# Patient Record
Sex: Female | Born: 1975 | Race: Black or African American | Hispanic: No | Marital: Single | Smoking: Never smoker
Health system: Southern US, Community
[De-identification: ages and names within clinical notes are randomized; demographics above are authoritative.]

## PROBLEM LIST (undated history)

## (undated) HISTORY — PX: NO PAST SURGERIES: SHX2092

---

## 2007-05-19 ENCOUNTER — Emergency Department: Payer: Self-pay | Admitting: Emergency Medicine

## 2011-09-09 ENCOUNTER — Ambulatory Visit: Payer: Self-pay | Admitting: Family Medicine

## 2011-09-09 LAB — WET PREP, GENITAL

## 2012-03-17 DIAGNOSIS — I8310 Varicose veins of unspecified lower extremity with inflammation: Secondary | ICD-10-CM | POA: Insufficient documentation

## 2014-07-26 DIAGNOSIS — M4802 Spinal stenosis, cervical region: Secondary | ICD-10-CM | POA: Insufficient documentation

## 2017-02-11 DIAGNOSIS — H60333 Swimmer's ear, bilateral: Secondary | ICD-10-CM | POA: Insufficient documentation

## 2017-06-09 ENCOUNTER — Other Ambulatory Visit: Payer: Self-pay

## 2017-06-09 ENCOUNTER — Ambulatory Visit
Admission: RE | Admit: 2017-06-09 | Discharge: 2017-06-09 | Disposition: A | Discharge status: Home / Self Care | Payer: BC Managed Care – PPO | Source: Ambulatory Visit | Attending: Emergency Medicine | Admitting: Emergency Medicine

## 2017-06-09 ENCOUNTER — Ambulatory Visit
Admission: EM | Admit: 2017-06-09 | Discharge: 2017-06-09 | Disposition: A | Discharge status: Home / Self Care | Payer: BC Managed Care – PPO

## 2017-06-09 DIAGNOSIS — M7071 Other bursitis of hip, right hip: Secondary | ICD-10-CM

## 2017-06-09 DIAGNOSIS — M79604 Pain in right leg: Secondary | ICD-10-CM

## 2017-06-09 DIAGNOSIS — M5431 Sciatica, right side: Secondary | ICD-10-CM

## 2017-06-09 MED ORDER — CYCLOBENZAPRINE HCL 5 MG PO TABS: 5.0000 mg | ORAL_TABLET | Freq: Two times a day (BID) | ORAL | 0 refills | Status: DC | PRN

## 2017-06-09 MED ORDER — PREDNISONE 10 MG PO TABS: ORAL_TABLET | ORAL | 0 refills | Status: DC

## 2017-06-09 NOTE — ED Provider Notes (Signed)
[facility] ____________________________________________  Time seen: Approximately 1221 PM  I have reviewed the triage vital signs and the nursing notes.   HISTORY  Chief Complaint Groin Pain  HPI Jody Paul is a 42 y.o. female presenting for evaluation of right lower back and hip pain that is been present constantly since this morning.  Patient reports she initially had same pain on 05/31/17 that lasted few a few days and then  mostly resolved, but states increased again today.  Denies any fall, direct trauma or known injury.  Denies history of same in the past.  States pain stays constantly and right upper buttocks and right hip, intermittently and right anterior hip.  States that she intermittently has pain described as a burning tingly pain that radiates down her right leg from the back of her right buttocks to her foot.  Denies loss of sensation.  Denies urinary or bowel retention or incontinence.  Denies any rash or skin changes.  Again denies trauma.  Denies known trigger.  Denies any urinary frequency, urinary urgency or urinary frequency.  Denies fevers, cough, abdominal pain, flank pain.  Denies chance of pregnancy.  States does not like to take medications very often and has very only occasionally taken any over-the-counter medication.  States occasionally has taken aspirin without any change.  Has tried stretching the area.  States pain improves with standing still and leaning on her left leg, as well as laying still, states pain is worse with direct palpation to right posterior hip, bending and moving.  Again reports otherwise feels well.  Denies any recent immobilization, hemoptysis, oral contraceptives, leg swelling or skin changes.Denies recent sickness. Denies recent antibiotic use.   Patient's last menstrual period was 05/27/2017.Denies pregnancy.  States last period spotted longer than normal.  Denies any continued bleeding.   History reviewed. No pertinent  past medical history.  There are no active problems to display for this patient.   Past Surgical History:  Procedure Laterality Date  . NO PAST SURGERIES       No current facility-administered medications for this encounter.   Current Outpatient Medications:  .  Calcium Carbonate-Vit D-Min (CALCIUM 1200 PO), Take by mouth., Disp: , Rfl:  .  ELDERBERRY PO, Take by mouth., Disp: , Rfl:  .  Flaxseed, Linseed, (FLAX SEED OIL) 1000 MG CAPS, Take by mouth., Disp: , Rfl:  .  Multiple Vitamin (MULTIVITAMIN) LIQD, Take 5 mLs by mouth daily., Disp: , Rfl:  .  cyclobenzaprine (FLEXERIL) 5 MG tablet, Take 1 tablet (5 mg total) by mouth 2 (two) times daily as needed for muscle spasms. Do not drive as can cause drowsiness, Disp: 15 tablet, Rfl: 0 .  predniSONE (DELTASONE) 10 MG tablet, Start 60 mg po day one, then 50 mg po day two, taper by 10 mg daily until complete., Disp: 21 tablet, Rfl: 0  Allergies Patient has no known allergies.  History reviewed. No pertinent family history.  Social History Social History   Tobacco Use  . Smoking status: Never Smoker  . Smokeless tobacco: Never Used  Substance Use Topics  . Alcohol use: Yes    Comment: occasionally  . Drug use: No    Review of Systems Constitutional: No fever/chills Cardiovascular: Denies chest pain. Respiratory: Denies shortness of breath. Gastrointestinal: No abdominal pain.  No nausea, no vomiting.  No diarrhea.  No constipation. Genitourinary: Negative for dysuria. Musculoskeletal: As above.  Skin: Negative for rash. Neurological: Negative for headaches or focal weakness.   ____________________________________________  PHYSICAL EXAM:  VITAL SIGNS: ED Triage Vitals  Enc Vitals Group     BP 06/09/17 1216 120/83     Pulse Rate 06/09/17 1216 68     Resp 06/09/17 1216 16     Temp 06/09/17 1216 98.1 F (36.7 C)     Temp Source 06/09/17 1216 Oral     SpO2 06/09/17 1216 100 %     Weight 06/09/17 1213 130 lb (59  kg)     Height 06/09/17 1213 5\' 2"  (1.575 m)     Head Circumference --      Peak Flow --      Pain Score 06/09/17 1213 9     Pain Loc --      Pain Edu? --      Excl. in [place]? --     Constitutional: Alert and oriented. Well appearing and in no acute distress. Eyes: Conjunctivae are normal.  ENT      Head: Normocephalic and atraumatic.      Nose: No congestion/rhinnorhea.      Mouth/Throat: Mucous membranes are moist.Oropharynx non-erythematous. Neck: No stridor. Supple without meningismus.  Hematological/Lymphatic/Immunilogical: No cervical lymphadenopathy. Cardiovascular: Normal rate, regular rhythm. Grossly normal heart sounds.  Good peripheral circulation. Respiratory: Normal respiratory effort without tachypnea nor retractions. Breath sounds are clear and equal bilaterally. No wheezes, rales, rhonchi. Gastrointestinal: Soft and nontender. No distention. Normal Bowel sounds. No CVA tenderness. Musculoskeletal:  Nontender with normal range of motion in all extremities. No midline cervical, thoracic or lumbar tenderness to palpation. Bilateral pedal pulses equal and easily palpated.      Right lower leg:  No tenderness or edema.      Left lower leg:  No tenderness or edema.  Except: Right lateral hip that point greater trochanter mild tenderness to palpation as well as mild tenderness palpation right piriformis right and moderate tenderness to right lower sciatic notch area, mild tenderness to anterior right hip flexor, right leg and right back pain increase with right hip lateral abduction as well as standing knee lift, no pain with left knee lifts, able to weight-bear independently each leg, bilateral plantar flexion and dorsiflexion strong and equal, no saddle anesthesia, right lower extremity otherwise nontender.  No rash, no lesions and no ecchymosis.  No other pelvic or bony tenderness noted. Changes positions quickly. Ambulatory in room with antalgic gait. Neurologic:  Normal speech and  language. No gross focal neurologic deficits are appreciated. Speech is normal.  Skin:  Skin is warm, dry and intact. No rash noted. Psychiatric: Mood and affect are normal. Speech and behavior are normal. Patient exhibits appropriate insight and judgment   ___________________________________________   LABS (all labs ordered are listed, but only abnormal results are displayed)  Labs Reviewed - No data to display ____________________________________________  RADIOLOGY  US Venous Img Lower Unilateral Right  Result Date: 06/09/2017 CLINICAL DATA:  Right lower extremity pain for the past 2 days. Evaluate for DVT. EXAM: RIGHT LOWER EXTREMITY VENOUS DOPPLER ULTRASOUND TECHNIQUE: Gray-scale sonography with graded compression, as well as color Doppler and duplex ultrasound were performed to evaluate the lower extremity deep venous systems from the level of the common femoral vein and including the common femoral, femoral, profunda femoral, popliteal and calf veins including the posterior tibial, peroneal and gastrocnemius veins when visible. The superficial great saphenous vein was also interrogated. Spectral Doppler was utilized to evaluate flow at rest and with distal augmentation maneuvers in the common femoral, femoral and popliteal veins. COMPARISON:  None. FINDINGS: Contralateral  Common Femoral Vein: Respiratory phasicity is normal and symmetric with the symptomatic side. No evidence of thrombus. Normal compressibility. Common Femoral Vein: No evidence of thrombus. Normal compressibility, respiratory phasicity and response to augmentation. Saphenofemoral Junction: No evidence of thrombus. Normal compressibility and flow on color Doppler imaging. Profunda Femoral Vein: No evidence of thrombus. Normal compressibility and flow on color Doppler imaging. Femoral Vein: No evidence of thrombus. Normal compressibility, respiratory phasicity and response to augmentation. Popliteal Vein: No evidence of thrombus.  Normal compressibility, respiratory phasicity and response to augmentation. Calf Veins: No evidence of thrombus. Normal compressibility and flow on color Doppler imaging. Superficial Great Saphenous Vein: No evidence of thrombus. Normal compressibility. Venous Reflux:  None. Other Findings:  None. IMPRESSION: No evidence of DVT within the right lower extremity. Electronically Signed   By: Sandi Mariscal M.D.   On: 06/09/2017 13:51   ____________________________________________   PROCEDURES Procedures    INITIAL IMPRESSION / ASSESSMENT AND PLAN / ED COURSE  Pertinent labs & imaging results that were available during my care of the patient were reviewed by me and considered in my medical decision making (see chart for details).  Overall well-appearing patient.  Patient appears uncomfortable leaning on left leg and bending over exam table.  Patient full range of motion present exam as above.  Suspect inflammatory changes, right hip bursitis and right sciatica.  No focal neurological deficits.  Doubt infectious.  Doubt DVT, patient expresses concern of DVT and request ultrasound to be completed.  Ultrasound performed outpatient and ultrasound venous right lower extremity results as above and negative for DVT per radiologist.  Patient informed of this.  Encouraged rest, supportive care, stretching and will treat patient with oral prednisone and PRN Flexeril.  Patient declines pain medication prescription.  Discussed strict follow-up and return parameters.Discussed indication, risks and benefits of medications with patient.  Discussed follow up with Primary care physician this week. Discussed follow up and return parameters including no resolution or any worsening concerns. Patient verbalized understanding and agreed to plan.   ____________________________________________   FINAL CLINICAL IMPRESSION(S) / ED DIAGNOSES  Final diagnoses:  Right sided sciatica  Bursitis of right hip, unspecified bursa    Right leg pain     ED Discharge Orders        Ordered    US Venous Img Lower Unilateral Right     06/09/17 1242    predniSONE (DELTASONE) 10 MG tablet     06/09/17 1245    cyclobenzaprine (FLEXERIL) 5 MG tablet  2 times daily PRN     06/09/17 1245       Note: This dictation was prepared with Dragon dictation along with smaller phrase technology. Any transcriptional errors that result from this process are unintentional.         Marylene Land, NP 06/09/17 1416

## 2017-06-09 NOTE — ED Triage Notes (Signed)
Patient complains of right groin pain that started on 1/14. Patient states that it improved but worsened again this morning. Patient states that she has pain down the back of right leg and radiates down. Patient states that she has had tingling in her right leg.

## 2017-06-09 NOTE — Discharge Instructions (Signed)
Take medication as prescribed. Rest. Drink plenty of fluids. Ice. Stretch. Have ultrasound completed.   Follow up with your primary care physician this week as needed. Return to Urgent care or Emergency room for new or worsening concerns.

## 2018-01-13 DIAGNOSIS — Z8742 Personal history of other diseases of the female genital tract: Secondary | ICD-10-CM | POA: Insufficient documentation

## 2018-04-05 IMAGING — US US EXTREM LOW VENOUS*R*
1 series · 13 of 24 positions shown · non-contrast
Comparison: None.

CLINICAL DATA: Right lower extremity pain for the past 2 days.
Evaluate for DVT.



[Series 1: us extrem low venous*right* · 0.06mm/px · 13 of 34 slices shown]
[im 1/34]
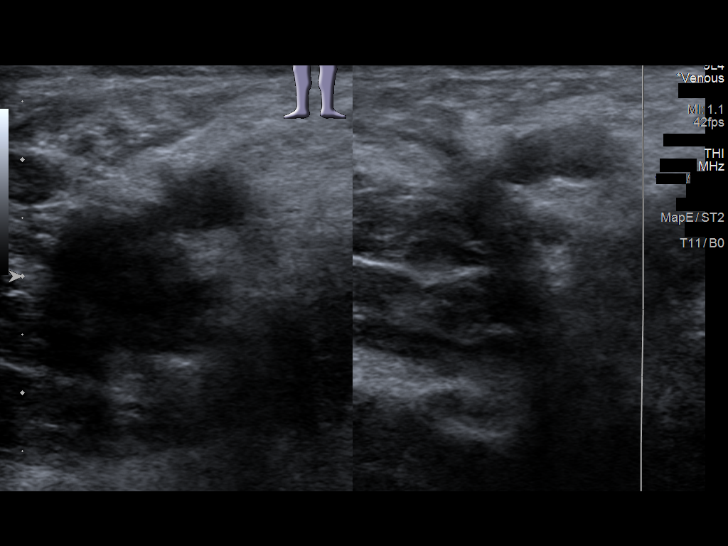
[im 3/34]
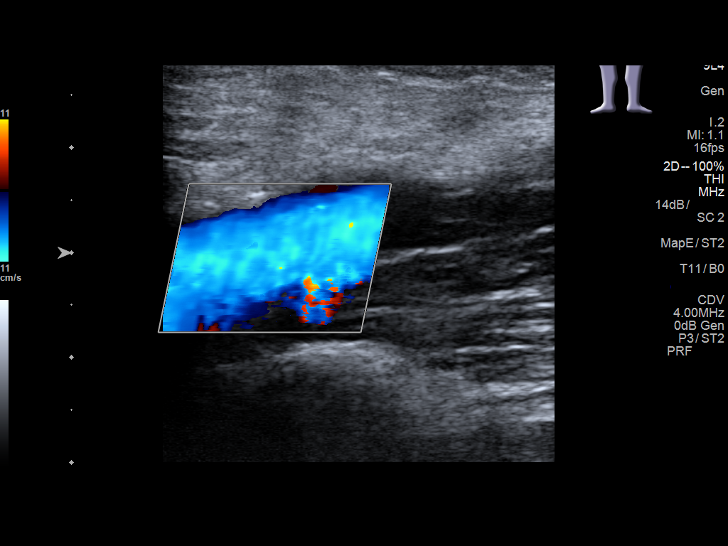
[im 6/34]
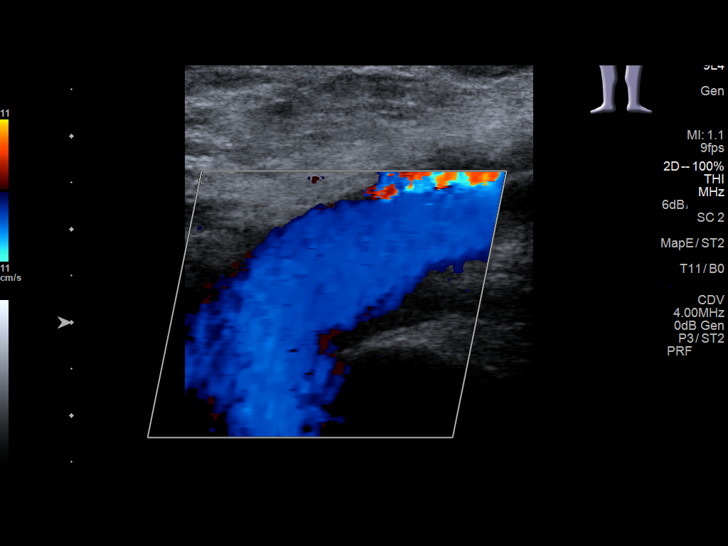
[im 9/34]
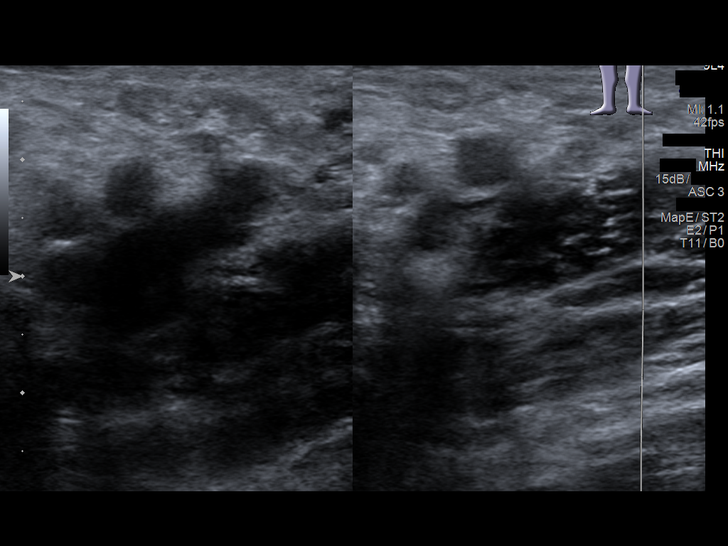
[im 12/34]
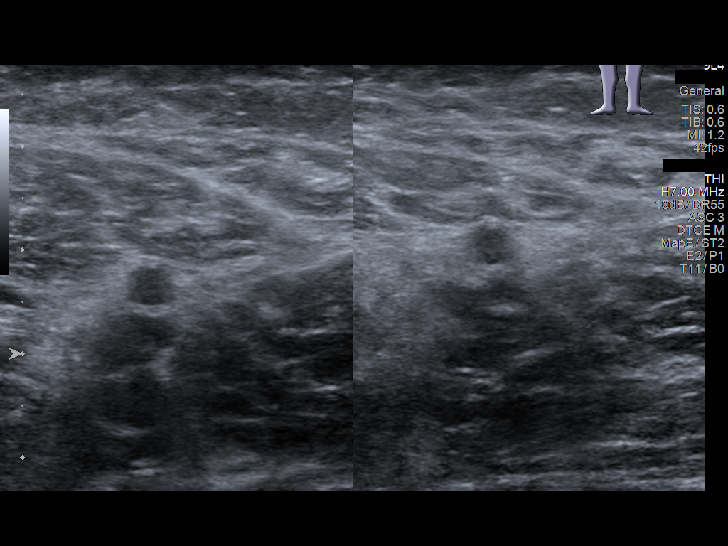
[im 15/34]
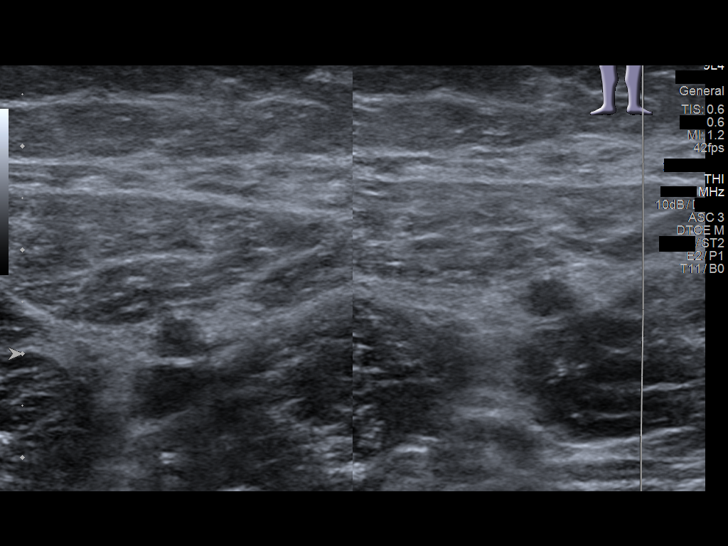
[im 18/34]
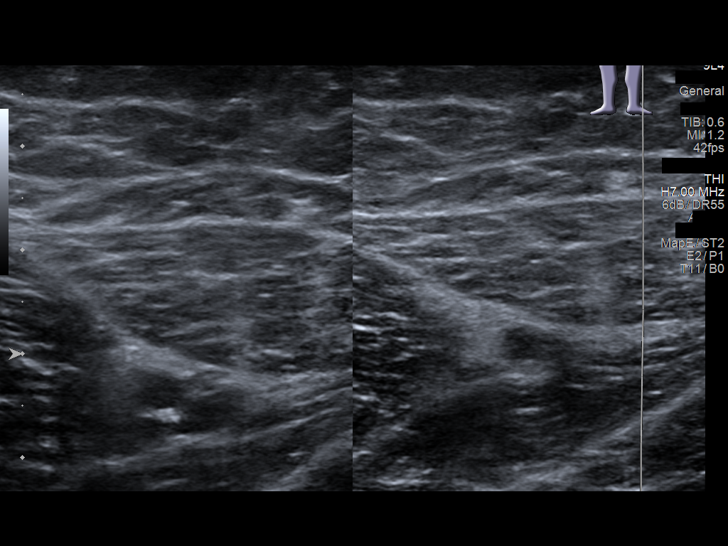
[im 19/34]
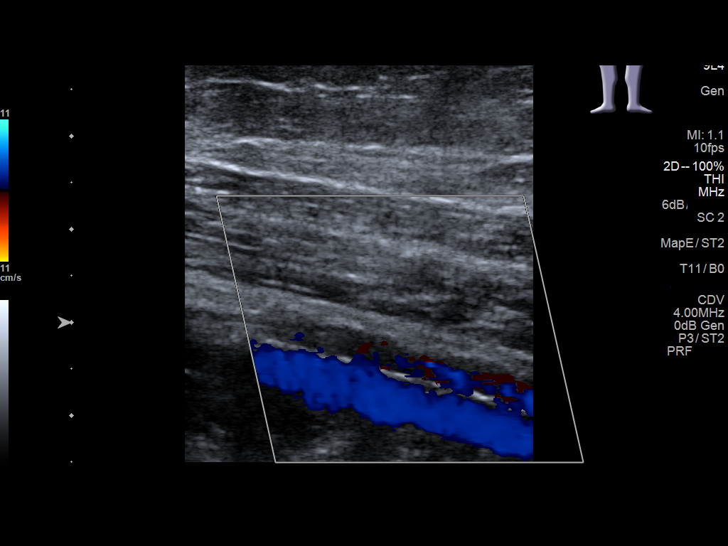
[im 22/34]
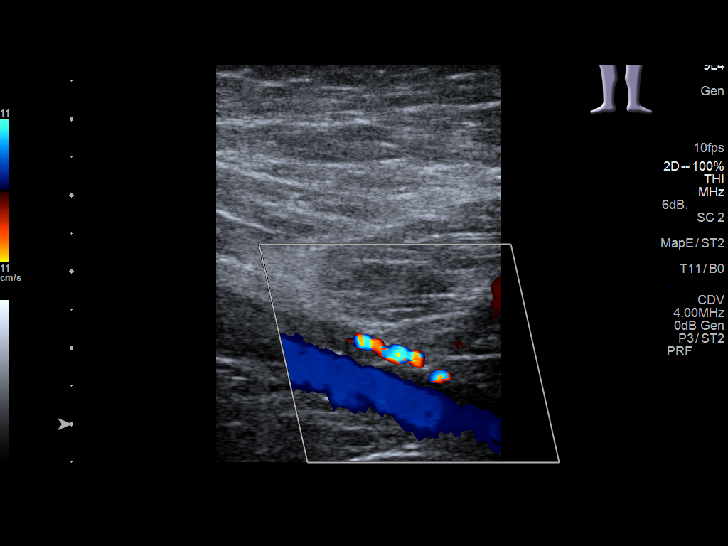
[im 25/34]
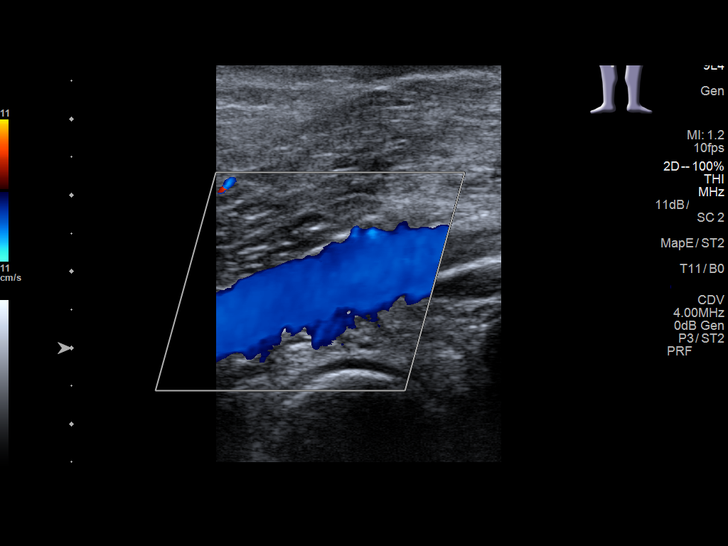
[im 28/34]
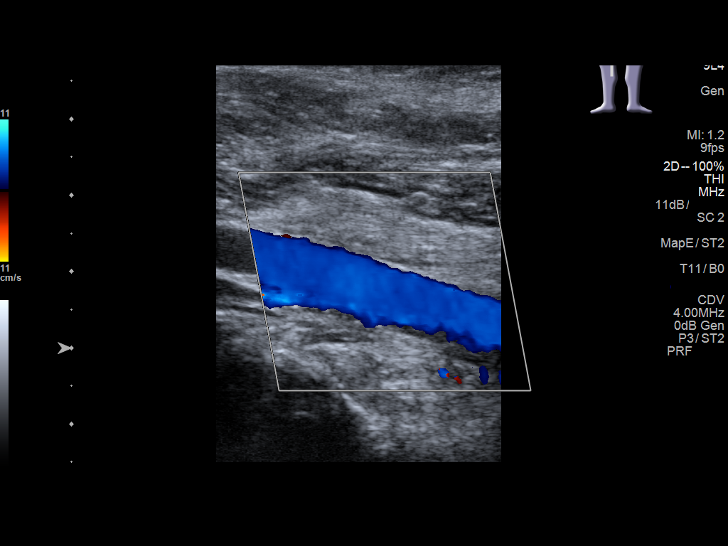
[im 31/34]
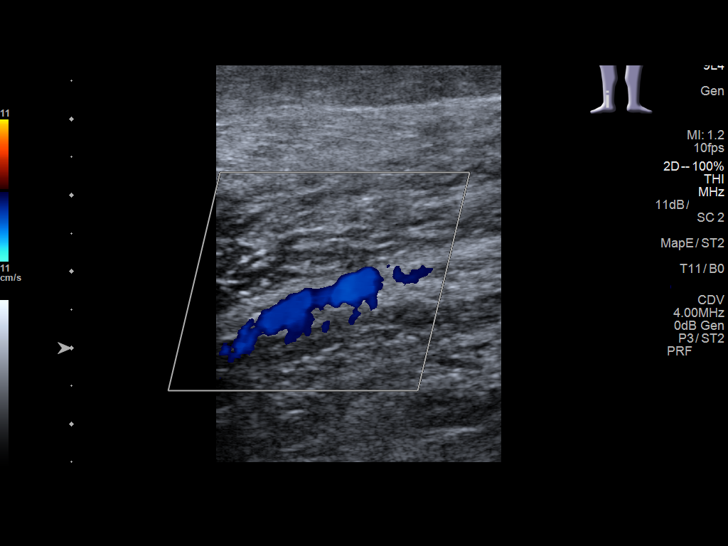
[im 34/34]
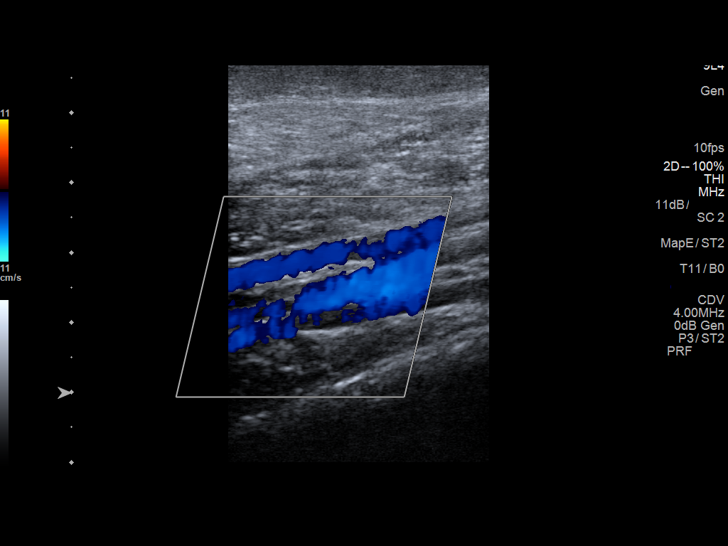

[13 of 24 positions shown; findings below may reference images not displayed]

FINDINGS: Contralateral Common Femoral Vein: Respiratory phasicity is normal
and symmetric with the symptomatic side. No evidence of thrombus.
Normal compressibility.

Common Femoral Vein: No evidence of thrombus. Normal
compressibility, respiratory phasicity and response to augmentation.

Saphenofemoral Junction: No evidence of thrombus. Normal
compressibility and flow on color Doppler imaging.

Profunda Femoral Vein: No evidence of thrombus. Normal
compressibility and flow on color Doppler imaging.

Femoral Vein: No evidence of thrombus. Normal compressibility,
respiratory phasicity and response to augmentation.

Popliteal Vein: No evidence of thrombus. Normal compressibility,
respiratory phasicity and response to augmentation.

Calf Veins: No evidence of thrombus. Normal compressibility and flow
on color Doppler imaging.

Superficial Great Saphenous Vein: No evidence of thrombus. Normal
compressibility.

Venous Reflux:  None.

Other Findings:  None.
IMPRESSION: No evidence of DVT within the right lower extremity.

## 2019-02-06 DIAGNOSIS — G5620 Lesion of ulnar nerve, unspecified upper limb: Secondary | ICD-10-CM | POA: Insufficient documentation

## 2019-02-06 DIAGNOSIS — R200 Anesthesia of skin: Secondary | ICD-10-CM | POA: Insufficient documentation

## 2019-10-25 DIAGNOSIS — R4020 Unspecified coma: Secondary | ICD-10-CM | POA: Insufficient documentation

## 2019-10-25 DIAGNOSIS — G43719 Chronic migraine without aura, intractable, without status migrainosus: Secondary | ICD-10-CM | POA: Insufficient documentation

## 2019-10-25 DIAGNOSIS — S0990XA Unspecified injury of head, initial encounter: Secondary | ICD-10-CM | POA: Insufficient documentation

## 2019-10-25 DIAGNOSIS — G43909 Migraine, unspecified, not intractable, without status migrainosus: Secondary | ICD-10-CM | POA: Insufficient documentation

## 2019-12-05 DIAGNOSIS — R519 Headache, unspecified: Secondary | ICD-10-CM | POA: Insufficient documentation

## 2020-07-01 LAB — RESULTS CONSOLE HPV: CHL HPV: POSITIVE

## 2021-05-02 LAB — CBC AND DIFFERENTIAL
HCT: 31 — AB (ref 36–46)
Hemoglobin: 10 — AB (ref 12.0–16.0)
Platelets: 300 10*3/uL (ref 150–400)
WBC: 4

## 2021-05-02 LAB — BASIC METABOLIC PANEL
BUN: 7 (ref 4–21)
CO2: 29 — AB (ref 13–22)
Chloride: 106 (ref 99–108)
Creatinine: 0.6 (ref <=1.1)
Glucose: 80
Potassium: 3.3 mEq/L — AB (ref 3.5–5.1)
Sodium: 141 (ref 137–147)

## 2021-05-02 LAB — HEPATIC FUNCTION PANEL
ALT: 10 U/L (ref 7–35)
AST: 21 (ref 13–35)
Alkaline Phosphatase: 56 (ref 25–125)
Bilirubin, Total: 0.3

## 2021-05-02 LAB — COMPREHENSIVE METABOLIC PANEL: Calcium: 8.9 (ref 8.7–10.7)

## 2021-07-23 ENCOUNTER — Encounter: Payer: Self-pay | Admitting: Family Medicine

## 2021-07-23 ENCOUNTER — Ambulatory Visit: Payer: BC Managed Care – PPO | Admitting: Family Medicine

## 2021-07-23 ENCOUNTER — Other Ambulatory Visit: Payer: Self-pay

## 2021-07-23 VITALS — BP 128/80 | HR 66 | Ht 62.0 in | Wt 124.6 lb

## 2021-07-23 DIAGNOSIS — R071 Chest pain on breathing: Secondary | ICD-10-CM

## 2021-07-23 DIAGNOSIS — J4540 Moderate persistent asthma, uncomplicated: Secondary | ICD-10-CM | POA: Diagnosis not present

## 2021-07-23 DIAGNOSIS — R6881 Early satiety: Secondary | ICD-10-CM | POA: Diagnosis not present

## 2021-07-23 DIAGNOSIS — J45909 Unspecified asthma, uncomplicated: Secondary | ICD-10-CM | POA: Insufficient documentation

## 2021-07-23 DIAGNOSIS — D219 Benign neoplasm of connective and other soft tissue, unspecified: Secondary | ICD-10-CM | POA: Insufficient documentation

## 2021-07-23 DIAGNOSIS — D649 Anemia, unspecified: Secondary | ICD-10-CM | POA: Insufficient documentation

## 2021-07-23 DIAGNOSIS — M5030 Other cervical disc degeneration, unspecified cervical region: Secondary | ICD-10-CM | POA: Insufficient documentation

## 2021-07-23 DIAGNOSIS — D500 Iron deficiency anemia secondary to blood loss (chronic): Secondary | ICD-10-CM | POA: Diagnosis not present

## 2021-07-23 NOTE — Assessment & Plan Note (Signed)
Lungs clear today. Continue dulera. Advised her to use it daily and to use the albuterol prior to exercise. Call with any concerns. Continue to monitor.  ?

## 2021-07-23 NOTE — Progress Notes (Signed)
BP 128/80    Pulse 66    Ht '5\' 2"'$  (1.575 m)    Wt 124 lb 9.6 oz (56.5 kg)    LMP 07/23/2021 (Exact Date)    SpO2 100%    BMI 22.79 kg/m    Subjective:    Patient ID: Jody Paul, female    DOB: [birth year], 46 y.o.   MRN: [id number]  HPI: Jody Paul is a 46 y.o. female who presents today to establish care.  Chief Complaint  Patient presents with   Establish Care    Patient requesting GI referral patient states she has the desire to eat but gets full quickly.   early satiety   ANEMIA Anemia status: stable Etiology of anemia: iron def Duration of anemia treatment: chronic  Compliance with treatment: excellent compliance Iron supplementation side effects: yes- constipation. Better on liquid iron Severity of anemia: moderate Fatigue: yes Decreased exercise tolerance: yes  Dyspnea on exertion: yes Palpitations: no Bleeding: no Pica: no  Paul presents today to establish care. She has been having some pain in her R hip with her cycle for about the past 4 years. It is doing better. She notes that she was checked for a blood clot previously and it was negative. She does see GYN. She does have a fibroid. She has been having issues with her stomach for about 4 years. She has some chest pain which has been worked up extensively by cardiology and does not seem to be cardiac. There is a concern that it is esophageal. She has been having early satiety. Saw GI several years ago and had a liver hemangioma on an [place]. She changed her diet and started to feel a little better. Has been a bit constipated. Taking miralax occasionally. Stopped the iron pills and went to liquid iron. 2020 started with some issues with breathing. This has been much better since she started on the dulera. 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 She is generally feeling well. No other concerns or complaints at this time.   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   Calcium Carbonate-Vit D-Min (CALCIUM 1200 PO) Take by mouth.   ELDERBERRY PO Take by mouth.   Flaxseed, Linseed, (FLAX SEED OIL) 1000 MG CAPS Take by mouth.   mometasone-formoterol (DULERA) 100-5 MCG/ACT AERO Inhale 2 puffs into the lungs 2 (two) times daily.   Multiple Vitamin (MULTIVITAMIN) LIQD Take 5 mLs by mouth daily.   [DISCONTINUED] cyclobenzaprine (FLEXERIL) 5 MG tablet Take 1 tablet (5 mg total) by mouth 2 (two) times daily as needed for muscle spasms. Do not drive as can cause drowsiness (Patient  not taking: Reported on 07/23/2021)   [DISCONTINUED] predniSONE (DELTASONE) 10 MG tablet Start 60 mg po day one, then 50 mg po day two, taper by 10 mg daily until complete. (Patient not taking: Reported on 07/23/2021)   No facility-administered encounter medications on file as of 07/23/2021.   No Known Allergies  Social History   Socioeconomic History   Marital status: Single    Spouse name: Not on file   Number of children: Not on file   Years of education: Not on file   Highest education level: Not on file  Occupational History   Not on file  Tobacco Use   Smoking status: Never   Smokeless tobacco: Never  Vaping Use   Vaping Use:  Never used  Substance and Sexual Activity   Alcohol use: Yes    Alcohol/week: 1.0 standard drink    Types: 1 Glasses of wine per week    Comment: occasionally   Drug use: No   Sexual activity: Not Currently  Other Topics Concern   Not on file  Social History Narrative   Not on file   Social Determinants of Health   Financial Resource Strain: Not on file  Food Insecurity: Not on file  Transportation Needs: Not on file  Physical Activity: Not on file  Stress: Not on file  Social Connections: Not on file   Family History  Problem Relation Age of Onset   Stroke Mother    Cancer Mother        lung cancer   Cancer Maternal Grandfather      Review of Systems  Constitutional: Negative.   Respiratory: Negative.    Cardiovascular: Negative.   Gastrointestinal:  Positive for abdominal pain. Negative for abdominal distention, anal bleeding, blood in stool, constipation, diarrhea, nausea, rectal pain and vomiting.  Musculoskeletal: Negative.   Psychiatric/Behavioral: Negative.     Per HPI unless specifically indicated above     Objective:    BP 128/80    Pulse 66    Ht '5\' 2"'$  (1.575 m)    Wt 124 lb 9.6 oz (56.5 kg)    LMP 07/23/2021 (Exact Date)    SpO2 100%    BMI 22.79 kg/m   Wt Readings from Last 3 Encounters:  07/23/21 124 lb 9.6 oz (56.5 kg)  06/09/17 130 lb (59 kg)    Physical Exam Vitals and nursing note reviewed.  Constitutional:      General: She is not in acute distress.    Appearance: Normal appearance. She is not ill-appearing, toxic-appearing or diaphoretic.  HENT:     Head: Normocephalic and atraumatic.     Right Ear: External ear normal.     Left Ear: External ear normal.     Nose: Nose normal.     Mouth/Throat:     Mouth: Mucous membranes are moist.     Pharynx: Oropharynx is clear.  Eyes:     General: No scleral icterus.       Right eye: No discharge.        Left eye: No discharge.     Extraocular Movements: Extraocular movements intact.      Conjunctiva/sclera: Conjunctivae normal.     Pupils: Pupils are equal, round, and reactive to light.  Cardiovascular:     Rate and Rhythm: Normal rate and regular rhythm.     Pulses: Normal pulses.     Heart sounds: Normal heart sounds. No murmur heard.   No friction rub. No gallop.  Pulmonary:  Effort: Pulmonary effort is normal. No respiratory distress.     Breath sounds: Normal breath sounds. No stridor. No wheezing, rhonchi or rales.  Chest:     Chest wall: No tenderness.  Abdominal:     General: Abdomen is flat. Bowel sounds are normal. There is no distension.     Palpations: Abdomen is soft. There is no mass.     Tenderness: There is no abdominal tenderness. There is no right CVA tenderness, left CVA tenderness, guarding or rebound.     Hernia: No hernia is present.  Musculoskeletal:        General: Normal range of motion.     Cervical back: Normal range of motion and neck supple.  Skin:    General: Skin is warm and dry.     Capillary Refill: Capillary refill takes less than 2 seconds.     Coloration: Skin is not jaundiced or pale.     Findings: No bruising, erythema, lesion or rash.  Neurological:     General: No focal deficit present.     Mental Status: She is alert and oriented to person, place, and time. Mental status is at baseline.  Psychiatric:        Mood and Affect: Mood normal.        Behavior: Behavior normal.        Thought Content: Thought content normal.        Judgment: Judgment normal.    Results for orders placed or performed in visit on 07/23/21  CBC and differential  Result Value Ref Range   Hemoglobin 10.0 (A) 12.0 - 16.0   HCT 31 (A) 36 - 46   Platelets 300 150 - 400 K/uL   WBC 4.0   Basic metabolic panel  Result Value Ref Range   Glucose 80    BUN 7 4 - 21   CO2 29 (A) 13 - 22   Creatinine 0.6 0.5 - 1.1   Potassium 3.3 (A) 3.5 - 5.1 mEq/L   Sodium 141 137 - 147   Chloride 106 99 - 108  Comprehensive metabolic panel  Result Value Ref  Range   Calcium 8.9 8.7 - 10.7  Hepatic function panel  Result Value Ref Range   Alkaline Phosphatase 56 25 - 125   ALT 10 7 - 35 U/L   AST 21 13 - 35   Bilirubin, Total 0.3   Results Console HPV  Result Value Ref Range   CHL HPV Positive       Assessment & Plan:   Problem List Items Addressed This Visit       Respiratory   Asthma    Lungs clear today. Continue dulera. Advised her to use it daily and to use the albuterol prior to exercise. Call with any concerns. Continue to monitor.       Relevant Medications   mometasone-formoterol (DULERA) 100-5 MCG/ACT AERO   albuterol (VENTOLIN HFA) 108 (90 Base) MCG/ACT inhaler     Other   Anemia    Continue iron supplementation. Last check had a hgb of 10. Will recheck next visit. Will get her into GI for occult GI loss. Likely due to menses.       Other Visit Diagnoses     Early satiety    -  Primary   Would like to see GI. Referral generated today. Call with any concerns.    Relevant Orders   Ambulatory referral to Gastroenterology   Chest pain on breathing       ?  esophageal vs muscular vs pulmonary. Will get her into GI for evaluation and try voltaren PRN to see if it's muscular. Call with any concerns.         Follow up plan: Return in about 6 months (around 01/23/2022) for physical.

## 2021-07-23 NOTE — Assessment & Plan Note (Signed)
Continue iron supplementation. Last check had a hgb of 10. Will recheck next visit. Will get her into GI for occult GI loss. Likely due to menses.  ?

## 2021-08-01 LAB — HM MAMMOGRAPHY

## 2021-08-06 ENCOUNTER — Other Ambulatory Visit: Payer: Self-pay | Admitting: Family Medicine

## 2021-08-06 MED ORDER — DICLOFENAC SODIUM 1 % EX GEL: 4.0000 g | Freq: Four times a day (QID) | CUTANEOUS | 12 refills | Status: AC

## 2021-09-04 LAB — HM PAP SMEAR

## 2021-09-04 LAB — RESULTS CONSOLE HPV: CHL HPV: POSITIVE

## 2021-10-28 ENCOUNTER — Ambulatory Visit: Payer: Self-pay | Admitting: *Deleted

## 2021-10-28 NOTE — Telephone Encounter (Signed)
  Chief Complaint: Headache Symptoms: Severe shooting pain back of head, left sided. States was lifting weights Friday when occurred. Radiates to left eye, eye with pressure. Went to eye doctor, given prednisone drops. States helped some. Now with worsening pain, severe eye pressure, blurred vision, constant. Reports nausea. Frequency: Friday Pertinent Negatives: Patient denies  Disposition: '[x]'$ ED /'[]'$ Urgent Care (no appt availability in office) / '[]'$ Appointment(In office/virtual)/ '[]'$  [facility]/ '[]'$ Home Care/ '[]'$ Refused Recommended Disposition /'[]'$ [facility]/ '[]'$  Follow-up with PCP Additional Notes: Advised ED. States will follow disposition, husband will drive. Reason for Disposition  Loss of vision or double vision (Exception: same as prior migraines)  Answer Assessment - Initial Assessment Questions 1. LOCATION: "Where does it hurt?"      Back of head, left side 2. ONSET: "When did the headache start?" (Minutes, hours or days)      Last Friday 3. PATTERN: "Does the pain come and go, or has it been constant since it started?"     Intermittent 4. SEVERITY: "How bad is the pain?" and "What does it keep you from doing?"  (e.g., Scale 1-10; mild, moderate, or severe)   - MILD (1-3): doesn't interfere with normal activities    - MODERATE (4-7): interferes with normal activities or awakens from sleep    - SEVERE (8-10): excruciating pain, unable to do any normal activities        Shooting, pressure. 10/10 when occurs 5. RECURRENT SYMPTOM: "Have you ever had headaches before?" If Yes, ask: "When was the last time?" and "What happened that time?"      No 6. CAUSE: "What do you think is causing the headache?"     Unsure 7. MIGRAINE: "Have you been diagnosed with migraine headaches?" If Yes, ask: "Is this headache similar?"      No 8. HEAD INJURY: "Has there been any recent injury to the head?"      "Lifted weights" 9. OTHER SYMPTOMS: "Do you have any other symptoms?"  (fever, stiff neck, eye pain, sore throat, cold symptoms)     Blurred vision, nausea since Sunday  Protocols used: [facility]

## 2021-12-09 LAB — HM COLONOSCOPY

## 2021-12-16 ENCOUNTER — Telehealth: Payer: Self-pay

## 2021-12-16 NOTE — Telephone Encounter (Signed)
Copied from [place] [id number]. Topic: General - Other >> Dec 16, 2021  1:50 PM Jody Paul wrote: Reason for YIR:SWNIOEV called in wanting to give Dr Wynetta Emery a heads up on email that will be sent to her, from [facility] for her to get support hose and shoes thru them and its not spam.

## 2021-12-17 NOTE — Telephone Encounter (Signed)
Attempted to contact patient, phone went to voicemail x2. FYI will look out for a fax coming through for patient.

## 2021-12-17 NOTE — Telephone Encounter (Signed)
Usually when these come through they want notes, and we have not talked about this specifically. Please get scheduled for at least virtual to make sure she can get these.

## 2021-12-17 NOTE — Telephone Encounter (Signed)
LVM asking patient to call back to schedule an appointment 

## 2021-12-29 NOTE — Telephone Encounter (Signed)
Attempted to contact patient to notify provider has signed off on form but office visit notes are required to be sent. Patient has an upcoming CPE, would patient like to wait until that appointment to discuss? Or sooner visit can be scheduled to discuss need for DME.

## 2022-01-02 ENCOUNTER — Encounter: Payer: Self-pay | Admitting: Family Medicine

## 2022-01-02 ENCOUNTER — Ambulatory Visit: Payer: BC Managed Care – PPO | Admitting: Family Medicine

## 2022-01-02 VITALS — BP 110/75 | HR 89 | Wt 124.9 lb

## 2022-01-02 DIAGNOSIS — R300 Dysuria: Secondary | ICD-10-CM

## 2022-01-02 DIAGNOSIS — R8281 Pyuria: Secondary | ICD-10-CM

## 2022-01-02 DIAGNOSIS — N760 Acute vaginitis: Secondary | ICD-10-CM | POA: Diagnosis not present

## 2022-01-02 DIAGNOSIS — N898 Other specified noninflammatory disorders of vagina: Secondary | ICD-10-CM

## 2022-01-02 DIAGNOSIS — B9689 Other specified bacterial agents as the cause of diseases classified elsewhere: Secondary | ICD-10-CM | POA: Diagnosis not present

## 2022-01-02 LAB — URINALYSIS, ROUTINE W REFLEX MICROSCOPIC
Bilirubin, UA: NEGATIVE
Glucose, UA: NEGATIVE
Ketones, UA: NEGATIVE
Nitrite, UA: NEGATIVE
RBC, UA: NEGATIVE
Specific Gravity, UA: 1.02 (ref 1.005–1.030)
Urobilinogen, Ur: 0.2 mg/dL (ref 0.2–1.0)
pH, UA: 8 — ABNORMAL HIGH (ref 5.0–7.5)

## 2022-01-02 LAB — MICROSCOPIC EXAMINATION

## 2022-01-02 LAB — WET PREP FOR TRICH, YEAST, CLUE
Clue Cell Exam: POSITIVE — AB
Trichomonas Exam: NEGATIVE
Yeast Exam: NEGATIVE

## 2022-01-02 MED ORDER — METRONIDAZOLE 500 MG PO TABS: 500.0000 mg | ORAL_TABLET | Freq: Two times a day (BID) | ORAL | 0 refills | Status: DC

## 2022-01-02 NOTE — Progress Notes (Signed)
BP 110/75   Pulse 89   Wt 124 lb 14.4 oz (56.7 kg)   SpO2 98%   BMI 22.84 kg/m    Subjective:    Patient ID: Jody Paul, female    DOB: November 23, 1975, 46 y.o.   MRN: [id number]  HPI: Jody Paul is a 46 y.o. female  Chief Complaint  Patient presents with   Urinary Tract Infection    Patient states she is having lower abdominal pain, spasms, and urinary frequency. Patient states she was having discharge so she took one dose monistat.   URINARY SYMPTOMS Duration: about a week Dysuria: yes Urinary frequency: yes Urgency: yes Small volume voids: no Symptom severity: no Urinary incontinence: no Foul odor: no Hematuria: no Abdominal pain: yes Back pain: no Suprapubic pain/pressure: yes Flank pain: no Fever:  no Vomiting: no Relief with cranberry juice: no Relief with pyridium: no Status: stable Previous urinary tract infection: yes Recurrent urinary tract infection: no Sexual activity: monogomous History of sexually transmitted disease: no Vaginal discharge: yes Treatments attempted: monistat   Relevant past medical, surgical, family and social history reviewed and updated as indicated. Interim medical history since our last visit reviewed. Allergies and medications reviewed and updated.  Review of Systems  Constitutional: Negative.   Respiratory: Negative.    Cardiovascular: Negative.   Gastrointestinal: Negative.   Genitourinary:  Positive for dysuria, frequency, urgency and vaginal discharge. Negative for decreased urine volume, difficulty urinating, dyspareunia, enuresis, flank pain, genital sores, hematuria, menstrual problem, pelvic pain, vaginal bleeding and vaginal pain.  Musculoskeletal: Negative.   Neurological: Negative.   Psychiatric/Behavioral: Negative.      Per HPI unless specifically indicated above     Objective:    BP 110/75   Pulse 89   Wt 124 lb 14.4 oz (56.7 kg)   SpO2 98%   BMI 22.84 kg/m   Wt Readings from Last 3  Encounters:  01/02/22 124 lb 14.4 oz (56.7 kg)  07/23/21 124 lb 9.6 oz (56.5 kg)  06/09/17 130 lb (59 kg)    Physical Exam Vitals and nursing note reviewed.  Constitutional:      General: She is not in acute distress.    Appearance: Normal appearance. She is normal weight. She is not ill-appearing, toxic-appearing or diaphoretic.  HENT:     Head: Normocephalic and atraumatic.     Right Ear: External ear normal.     Left Ear: External ear normal.     Nose: Nose normal.     Mouth/Throat:     Mouth: Mucous membranes are moist.     Pharynx: Oropharynx is clear.  Eyes:     General: No scleral icterus.       Right eye: No discharge.        Left eye: No discharge.     Extraocular Movements: Extraocular movements intact.     Conjunctiva/sclera: Conjunctivae normal.     Pupils: Pupils are equal, round, and reactive to light.  Cardiovascular:     Rate and Rhythm: Normal rate and regular rhythm.     Pulses: Normal pulses.     Heart sounds: Normal heart sounds. No murmur heard.    No friction rub. No gallop.  Pulmonary:     Effort: Pulmonary effort is normal. No respiratory distress.     Breath sounds: Normal breath sounds. No stridor. No wheezing, rhonchi or rales.  Chest:     Chest wall: No tenderness.  Musculoskeletal:        General: Normal  range of motion.     Cervical back: Normal range of motion and neck supple.  Skin:    General: Skin is warm and dry.     Capillary Refill: Capillary refill takes less than 2 seconds.     Coloration: Skin is not jaundiced or pale.     Findings: No bruising, erythema, lesion or rash.  Neurological:     General: No focal deficit present.     Mental Status: She is alert and oriented to person, place, and time. Mental status is at baseline.  Psychiatric:        Mood and Affect: Mood normal.        Behavior: Behavior normal.        Thought Content: Thought content normal.        Judgment: Judgment normal.     Results for orders placed or  performed in visit on 07/23/21  CBC and differential  Result Value Ref Range   Hemoglobin 10.0 (A) 12.0 - 16.0   HCT 31 (A) 36 - 46   Platelets 300 150 - 400 K/uL   WBC 4.0   Basic metabolic panel  Result Value Ref Range   Glucose 80    BUN 7 4 - 21   CO2 29 (A) 13 - 22   Creatinine 0.6 0.5 - 1.1   Potassium 3.3 (A) 3.5 - 5.1 mEq/L   Sodium 141 137 - 147   Chloride 106 99 - 108  Comprehensive metabolic panel  Result Value Ref Range   Calcium 8.9 8.7 - 10.7  Hepatic function panel  Result Value Ref Range   Alkaline Phosphatase 56 25 - 125   ALT 10 7 - 35 U/L   AST 21 13 - 35   Bilirubin, Total 0.3   Results Console HPV  Result Value Ref Range   CHL HPV Positive       Assessment & Plan:   Problem List Items Addressed This Visit   None Visit Diagnoses     BV (bacterial vaginosis)    -  Primary   Will treat with flagyl. Call if not getting better or getting worse.    Relevant Medications   metroNIDAZOLE (FLAGYL) 500 MG tablet   Vaginal itching       + clue cells   Relevant Orders   WET PREP FOR [place], YEAST, CLUE   Pyuria       1+ leuks. Will send for culture   Relevant Orders   Urine Culture   Dysuria       1+ leuks. Will send for culture   Relevant Orders   Urinalysis, Routine w reflex microscopic        Follow up plan: Return if symptoms worsen or fail to improve.

## 2022-01-04 LAB — URINE CULTURE

## 2022-01-16 ENCOUNTER — Encounter: Payer: Self-pay | Admitting: Family Medicine

## 2022-01-21 ENCOUNTER — Ambulatory Visit: Payer: Self-pay

## 2022-01-21 NOTE — Telephone Encounter (Signed)
Reason for Disposition . [1] Caller has URGENT medicine question about med that PCP or specialist prescribed AND [2] triager unable to answer question  Answer Assessment - Initial Assessment Questions 1. NAME of MEDICINE: "What medicine(s) are you calling about?"     Flaygl 2. QUESTION: "What is your question?" (e.g., double dose of medicine, side effect)     Pretty severe SE 3. PRESCRIBER: "Who prescribed the medicine?" Reason: if prescribed by specialist, call should be referred to that group.     PCP 4. SYMPTOMS: "Do you have any symptoms?" If Yes, ask: "What symptoms are you having?"  "How bad are the symptoms (e.g., mild, moderate, severe)     Severe headache, stomach discomfort- patient has been trying to tolerate- but she is not sure she can continue.  Is there an alternative? Patient is at work- may leave message if she is not able to answer.  Protocols used: Medication Question Call-A-AH

## 2022-01-21 NOTE — Telephone Encounter (Signed)
The patient called in stating she is having side effects from her Flagyl prescription. She has been having burning in her stomach along with nausea and headache. She states she doesn't think she will be able to take her last 4 pills and wants to know if she can take something instead possibly. Please assist patient further.   Left message to call back about symptoms.

## 2022-01-21 NOTE — Telephone Encounter (Signed)
Second attempt to reach pt. Left message to call back. 

## 2022-01-22 ENCOUNTER — Encounter: Payer: BC Managed Care – PPO | Admitting: Family Medicine

## 2022-01-22 NOTE — Telephone Encounter (Signed)
Medicine was prescribed 8/18. If having issues needs to be seen

## 2022-01-22 NOTE — Telephone Encounter (Signed)
Attempted to contact patient, NA went straight to VM. Left detailed message on VM for patient to call back and schedule appointment.

## 2022-01-23 NOTE — Telephone Encounter (Signed)
Patient returned call- she was late starting (01/16/22) treatment- she went on vacation and forgot medication- she took all but last 4 pills- she states symptoms have improved- but feels still something there- is there a gel she can use - or does PCP feel the pills were enough?

## 2022-01-23 NOTE — Telephone Encounter (Signed)
Attempted to contact patient NA LVM   OK for PEC to give patient provider advise if she calls back.

## 2022-01-23 NOTE — Telephone Encounter (Signed)
Needs to finish her abx and if not better needs to come in for another swab

## 2022-01-27 ENCOUNTER — Ambulatory Visit: Payer: BC Managed Care – PPO | Admitting: Nurse Practitioner

## 2022-01-27 ENCOUNTER — Encounter: Payer: Self-pay | Admitting: Nurse Practitioner

## 2022-01-27 VITALS — BP 120/79 | HR 64 | Temp 98.5°F | Wt 123.9 lb

## 2022-01-27 DIAGNOSIS — B9689 Other specified bacterial agents as the cause of diseases classified elsewhere: Secondary | ICD-10-CM

## 2022-01-27 DIAGNOSIS — N760 Acute vaginitis: Secondary | ICD-10-CM | POA: Diagnosis not present

## 2022-01-27 DIAGNOSIS — N898 Other specified noninflammatory disorders of vagina: Secondary | ICD-10-CM | POA: Diagnosis not present

## 2022-01-27 LAB — URINALYSIS, ROUTINE W REFLEX MICROSCOPIC
Bilirubin, UA: NEGATIVE
Glucose, UA: NEGATIVE
Leukocytes,UA: NEGATIVE
Nitrite, UA: NEGATIVE
RBC, UA: NEGATIVE
Specific Gravity, UA: 1.025 (ref 1.005–1.030)
Urobilinogen, Ur: 0.2 mg/dL (ref 0.2–1.0)
pH, UA: 7 (ref 5.0–7.5)

## 2022-01-27 LAB — WET PREP FOR TRICH, YEAST, CLUE
Clue Cell Exam: POSITIVE — AB
Trichomonas Exam: NEGATIVE
Yeast Exam: NEGATIVE

## 2022-01-27 MED ORDER — METRONIDAZOLE 0.75 % EX CREA: TOPICAL_CREAM | Freq: Two times a day (BID) | CUTANEOUS | 0 refills | Status: DC

## 2022-01-27 NOTE — Progress Notes (Unsigned)
BP 120/79   Pulse 64   Temp 98.5 F (36.9 C) (Oral)   Wt 123 lb 14.4 oz (56.2 kg)   SpO2 99%   BMI 22.66 kg/m    Subjective:    Patient ID: Jody Paul, female    DOB: [birth year], 46 y.o.   MRN: [id number]  HPI: Jody Paul is a 46 y.o. female  Chief Complaint  Patient presents with   Vaginal Discharge    Pt states she is still having vaginal issues. States she did not start the recent RX for flagyl until a week after it being prescribed due to going out of town the next day. States that she started feeling nauseous after taking the medication.   VAGINAL DISCHARGE Patient states she did not start the prescription until September 1.  When she was on the last 4 pills she called on the 6th and it felt like she was nausea, burning in her stomach, had foul stool.    Relevant past medical, surgical, family and social history reviewed and updated as indicated. Interim medical history since our last visit reviewed. Allergies and medications reviewed and updated.  Review of Systems  Gastrointestinal:  Positive for nausea.       Burning in stomach  Genitourinary:  Positive for vaginal discharge.    Per HPI unless specifically indicated above     Objective:    BP 120/79   Pulse 64   Temp 98.5 F (36.9 C) (Oral)   Wt 123 lb 14.4 oz (56.2 kg)   SpO2 99%   BMI 22.66 kg/m   Wt Readings from Last 3 Encounters:  01/27/22 123 lb 14.4 oz (56.2 kg)  01/02/22 124 lb 14.4 oz (56.7 kg)  07/23/21 124 lb 9.6 oz (56.5 kg)    Physical Exam Vitals and nursing note reviewed.  Constitutional:      General: She is not in acute distress.    Appearance: Normal appearance. She is normal weight. She is not ill-appearing, toxic-appearing or diaphoretic.  HENT:     Head: Normocephalic.     Right Ear: External ear normal.     Left Ear: External ear normal.     Nose: Nose normal.     Mouth/Throat:     Mouth: Mucous membranes are moist.     Pharynx: Oropharynx is clear.  Eyes:      General:        Right eye: No discharge.        Left eye: No discharge.     Extraocular Movements: Extraocular movements intact.     Conjunctiva/sclera: Conjunctivae normal.     Pupils: Pupils are equal, round, and reactive to light.  Cardiovascular:     Rate and Rhythm: Normal rate and regular rhythm.     Heart sounds: No murmur heard. Pulmonary:     Effort: Pulmonary effort is normal. No respiratory distress.     Breath sounds: Normal breath sounds. No wheezing or rales.  Musculoskeletal:     Cervical back: Normal range of motion and neck supple.  Skin:    General: Skin is warm and dry.     Capillary Refill: Capillary refill takes less than 2 seconds.  Neurological:     General: No focal deficit present.     Mental Status: She is alert and oriented to person, place, and time. Mental status is at baseline.  Psychiatric:        Mood and Affect: Mood normal.  Behavior: Behavior normal.        Thought Content: Thought content normal.        Judgment: Judgment normal.     Results for orders placed or performed in visit on 01/27/22  WET PREP FOR [place], YEAST, CLUE   Specimen: Sterile Swab   Sterile Swab  Result Value Ref Range   Trichomonas Exam Negative Negative   Yeast Exam Negative Negative   Clue Cell Exam Positive (A) Negative  Urinalysis, Routine w reflex microscopic  Result Value Ref Range   Specific Gravity, UA 1.025 1.005 - 1.030   pH, UA 7.0 5.0 - 7.5   Color, UA Yellow Yellow   Appearance Ur Clear Clear   Leukocytes,UA Negative Negative   Protein,UA Trace (A) Negative/Trace   Glucose, UA Negative Negative   Ketones, UA Trace (A) Negative   RBC, UA Negative Negative   Bilirubin, UA Negative Negative   Urobilinogen, Ur 0.2 0.2 - 1.0 mg/dL   Nitrite, UA Negative Negative      Assessment & Plan:   Problem List Items Addressed This Visit   None Visit Diagnoses     BV (bacterial vaginosis)    -  Primary   Will treat with Flagyl vaginal due to  patient not tolerating PO Flagyl well.  Complete course of medication. Follow up if symptoms not improved.   Vaginal discharge       Relevant Orders   WET PREP FOR [place], YEAST, CLUE (Completed)   Urinalysis, Routine w reflex microscopic (Completed)        Follow up plan: Return if symptoms worsen or fail to improve.

## 2022-01-27 NOTE — Telephone Encounter (Signed)
Patient has appointment today

## 2022-01-28 ENCOUNTER — Encounter: Payer: Self-pay | Admitting: Family Medicine

## 2022-01-28 ENCOUNTER — Telehealth: Payer: Self-pay | Admitting: Family Medicine

## 2022-01-28 MED ORDER — METRONIDAZOLE 0.75 % VA GEL: 1.0000 | Freq: Every day | VAGINAL | 0 refills | Status: DC

## 2022-01-28 NOTE — Telephone Encounter (Signed)
LVMTRC 

## 2022-01-28 NOTE — Progress Notes (Signed)
Results discussed with patient during visit.

## 2022-01-28 NOTE — Telephone Encounter (Signed)
Patient called and stated that the pharmacy will not dispense the applicators for metronidazole 0.75% cream because the sig states for topical use. Patient would like for the sig to changed to vaginal, so she can use medication internally. Patient states she is frustrated because she has been trying to get treatment for he symptoms since 01/16/22. Please advise patient.

## 2022-02-12 ENCOUNTER — Ambulatory Visit (INDEPENDENT_AMBULATORY_CARE_PROVIDER_SITE_OTHER): Payer: BC Managed Care – PPO | Admitting: Family Medicine

## 2022-02-12 ENCOUNTER — Encounter: Payer: Self-pay | Admitting: Family Medicine

## 2022-02-12 VITALS — BP 120/83 | HR 80 | Wt 123.4 lb

## 2022-02-12 DIAGNOSIS — Z0000 Encounter for general adult medical examination without abnormal findings: Secondary | ICD-10-CM | POA: Diagnosis not present

## 2022-02-12 DIAGNOSIS — N898 Other specified noninflammatory disorders of vagina: Secondary | ICD-10-CM

## 2022-02-12 DIAGNOSIS — M25551 Pain in right hip: Secondary | ICD-10-CM

## 2022-02-12 DIAGNOSIS — R0602 Shortness of breath: Secondary | ICD-10-CM | POA: Diagnosis not present

## 2022-02-12 LAB — WET PREP FOR TRICH, YEAST, CLUE
Clue Cell Exam: NEGATIVE
Trichomonas Exam: NEGATIVE
Yeast Exam: NEGATIVE

## 2022-02-12 LAB — URINALYSIS, ROUTINE W REFLEX MICROSCOPIC
Bilirubin, UA: NEGATIVE
Glucose, UA: NEGATIVE
Ketones, UA: NEGATIVE
Leukocytes,UA: NEGATIVE
Nitrite, UA: NEGATIVE
Protein,UA: NEGATIVE
RBC, UA: NEGATIVE
Specific Gravity, UA: 1.02 (ref 1.005–1.030)
Urobilinogen, Ur: 0.2 mg/dL (ref 0.2–1.0)
pH, UA: 7.5 (ref 5.0–7.5)

## 2022-02-12 NOTE — Progress Notes (Signed)
BP 120/83   Pulse 80   Wt 123 lb 6.4 oz (56 kg)   SpO2 99%   BMI 22.57 kg/m    Subjective:    Patient ID: Jody Paul, female    DOB: 01-Sep-1975, 46 y.o.   MRN: [id number]  HPI: Jody Paul is a 46 y.o. female presenting on 02/12/2022 for comprehensive medical examination. Current medical complaints include:  SHORTNESS OF BREATH Duration: 2020 Onset: sudden Description of breathing discomfort: feels like something is stuck in her lungs Severity: mild Episode duration: 15-20 minutes Frequency: 2-3x a week Related to exertion: unknown Cough: yes Chest tightness: unknown Wheezing: yes Fevers: no Chest pain: no Palpitations: yes  Nausea: yes Diaphoresis: no Deconditioning: no Status: better  Has been having a shooting pain in her R hip with her cycle since about 2018.   Menopausal Symptoms: no  Depression Screen done today and results listed below:     07/23/2021    8:23 AM  Depression screen PHQ 2/9  Decreased Interest 0  Down, Depressed, Hopeless 0  PHQ - 2 Score 0  Altered sleeping 2  Tired, decreased energy 2  Change in appetite 1  Feeling bad or failure about yourself  0  Trouble concentrating 0  Moving slowly or fidgety/restless 0  Suicidal thoughts 0  PHQ-9 Score 5   Past Medical History:  History reviewed. No pertinent past medical history.  Surgical History:  Past Surgical History:  Procedure Laterality Date   CESAREAN SECTION  05/18/2005   NO PAST SURGERIES     TUBAL LIGATION  1998    Medications:  Current Outpatient Medications on File Prior to Visit  Medication Sig   albuterol (VENTOLIN HFA) 108 (90 Base) MCG/ACT inhaler Inhale 2 puffs into the lungs every 6 (six) hours as needed for wheezing or shortness of breath.   Calcium Carbonate-Vit D-Min (CALCIUM 1200 PO) Take by mouth.   diclofenac Sodium (VOLTAREN) 1 % GEL Apply 4 g topically 4 (four) times daily.   ELDERBERRY PO Take by mouth.   Flaxseed, Linseed, (FLAX SEED OIL)  1000 MG CAPS Take by mouth.   metroNIDAZOLE (METROCREAM) 0.75 % cream Apply topically 2 (two) times daily. X 7 days   mometasone-formoterol (DULERA) 100-5 MCG/ACT AERO Inhale 2 puffs into the lungs 2 (two) times daily.   Multiple Vitamin (MULTIVITAMIN) LIQD Take 5 mLs by mouth daily.   No current facility-administered medications on file prior to visit.    Allergies:  No Known Allergies  Social History:  Social History   Socioeconomic History   Marital status: Single    Spouse name: Not on file   Number of children: Not on file   Years of education: Not on file   Highest education level: Not on file  Occupational History   Not on file  Tobacco Use   Smoking status: Never   Smokeless tobacco: Never  Vaping Use   Vaping Use: Never used  Substance and Sexual Activity   Alcohol use: Yes    Alcohol/week: 1.0 standard drink of alcohol    Types: 1 Glasses of wine per week    Comment: occasionally   Drug use: No   Sexual activity: Not Currently  Other Topics Concern   Not on file  Social History Narrative   Not on file   Social Determinants of Health   Financial Resource Strain: Not on file  Food Insecurity: Not on file  Transportation Needs: Not on file  Physical Activity: Not on file  Stress: Not on file  Social Connections: Not on file  Intimate Partner Violence: Not on file   Social History   Tobacco Use  Smoking Status Never  Smokeless Tobacco Never   Social History   Substance and Sexual Activity  Alcohol Use Yes   Alcohol/week: 1.0 standard drink of alcohol   Types: 1 Glasses of wine per week   Comment: occasionally    Family History:  Family History  Problem Relation Age of Onset   Stroke Mother    Cancer Mother        lung cancer   Cancer Maternal Grandfather     Past medical history, surgical history, medications, allergies, family history and social history reviewed with patient today and changes made to appropriate areas of the chart.    Review of Systems  Constitutional:  Positive for diaphoresis and malaise/fatigue. Negative for chills, fever and weight loss.  HENT: Negative.    Eyes: Negative.   Respiratory:  Positive for shortness of breath. Negative for cough, hemoptysis, sputum production and wheezing.   Cardiovascular:  Positive for chest pain and palpitations. Negative for orthopnea, claudication, leg swelling and PND.  Gastrointestinal:  Positive for abdominal pain. Negative for blood in stool, constipation, diarrhea, heartburn, melena, nausea and vomiting.  Genitourinary: Negative.   Musculoskeletal:  Positive for joint pain. Negative for back pain, falls, myalgias and neck pain.  Skin: Negative.        Spots on her hips under the skin  Neurological:  Positive for tingling. Negative for dizziness, tremors, sensory change, speech change, focal weakness, seizures, loss of consciousness, weakness and headaches.  Endo/Heme/Allergies: Negative.   Psychiatric/Behavioral: Negative.     All other ROS negative except what is listed above and in the HPI.      Objective:    BP 120/83   Pulse 80   Wt 123 lb 6.4 oz (56 kg)   SpO2 99%   BMI 22.57 kg/m   Wt Readings from Last 3 Encounters:  02/12/22 123 lb 6.4 oz (56 kg)  01/27/22 123 lb 14.4 oz (56.2 kg)  01/02/22 124 lb 14.4 oz (56.7 kg)    Physical Exam Vitals and nursing note reviewed.  Constitutional:      General: She is not in acute distress.    Appearance: Normal appearance. She is normal weight. She is not ill-appearing, toxic-appearing or diaphoretic.  HENT:     Head: Normocephalic and atraumatic.     Right Ear: Tympanic membrane, ear canal and external ear normal. There is no impacted cerumen.     Left Ear: Tympanic membrane, ear canal and external ear normal. There is no impacted cerumen.     Nose: Nose normal. No congestion or rhinorrhea.     Mouth/Throat:     Mouth: Mucous membranes are moist.     Pharynx: Oropharynx is clear. No oropharyngeal  exudate or posterior oropharyngeal erythema.  Eyes:     General: No scleral icterus.       Right eye: No discharge.        Left eye: No discharge.     Extraocular Movements: Extraocular movements intact.     Conjunctiva/sclera: Conjunctivae normal.     Pupils: Pupils are equal, round, and reactive to light.  Neck:     Vascular: No carotid bruit.  Cardiovascular:     Rate and Rhythm: Normal rate and regular rhythm.     Pulses: Normal pulses.     Heart sounds: No murmur heard.    No  friction rub. No gallop.  Pulmonary:     Effort: Pulmonary effort is normal. No respiratory distress.     Breath sounds: Normal breath sounds. No stridor. No wheezing, rhonchi or rales.  Chest:     Chest wall: No tenderness.  Abdominal:     General: Abdomen is flat. Bowel sounds are normal. There is no distension.     Palpations: Abdomen is soft. There is no mass.     Tenderness: There is no abdominal tenderness. There is no right CVA tenderness, left CVA tenderness, guarding or rebound.     Hernia: No hernia is present.  Genitourinary:    Comments: Breast and pelvic exams deferred with shared decision making Musculoskeletal:        General: No swelling, tenderness, deformity or signs of injury.     Cervical back: Normal range of motion and neck supple. No rigidity. No muscular tenderness.     Right lower leg: No edema.     Left lower leg: No edema.  Lymphadenopathy:     Cervical: No cervical adenopathy.  Skin:    General: Skin is warm and dry.     Capillary Refill: Capillary refill takes less than 2 seconds.     Coloration: Skin is not jaundiced or pale.     Findings: No bruising, erythema, lesion or rash.  Neurological:     General: No focal deficit present.     Mental Status: She is alert and oriented to person, place, and time. Mental status is at baseline.     Cranial Nerves: No cranial nerve deficit.     Sensory: No sensory deficit.     Motor: No weakness.     Coordination: Coordination  normal.     Gait: Gait normal.     Deep Tendon Reflexes: Reflexes normal.  Psychiatric:        Mood and Affect: Mood normal.        Behavior: Behavior normal.        Thought Content: Thought content normal.        Judgment: Judgment normal.     Results for orders placed or performed in visit on 02/12/22  WET PREP FOR [place], YEAST, CLUE   Specimen: Urine   Urine  Result Value Ref Range   Trichomonas Exam Negative Negative   Yeast Exam Negative Negative   Clue Cell Exam Negative Negative  Urinalysis, Routine w reflex microscopic  Result Value Ref Range   Specific Gravity, UA 1.020 1.005 - 1.030   pH, UA 7.5 5.0 - 7.5   Color, UA Yellow Yellow   Appearance Ur Cloudy (A) Clear   Leukocytes,UA Negative Negative   Protein,UA Negative Negative/Trace   Glucose, UA Negative Negative   Ketones, UA Negative Negative   RBC, UA Negative Negative   Bilirubin, UA Negative Negative   Urobilinogen, Ur 0.2 0.2 - 1.0 mg/dL   Nitrite, UA Negative Negative      Assessment & Plan:   Problem List Items Addressed This Visit   None Visit Diagnoses     Routine general medical examination at a health care facility    -  Primary   Vaccines up to date. Screening labs checked today. Pap, mammo and colonoscopy up to date. Continue diet and exercise. Call with any concerns.    Relevant Orders   CBC with Differential/Platelet   Comprehensive metabolic panel   Lipid Panel w/o Chol/HDL Ratio   Urinalysis, Routine w reflex microscopic (Completed)   TSH   Hepatitis C  Antibody   HIV Antibody (routine testing w rflx)   SOB (shortness of breath)       Spiro normal. Will check CXR. Continue to monitor. Call with any concerns.    Relevant Orders   DG Chest 2 View   Spirometry with graph (Completed)   Vaginal discharge       Will check wet prep. Await results.    Relevant Orders   WET PREP FOR [place], YEAST, CLUE (Completed)   Right hip pain       Will check x-ray. Await results. Treat as needed.     Relevant Orders   DG Hip Unilat W OR W/O Pelvis 2-3 Views Right        Follow up plan: Return in about 1 year (around 02/13/2023) for Physcial .   LABORATORY TESTING:  - Pap smear: up to date  IMMUNIZATIONS:   - Tdap: Tetanus vaccination status reviewed: last tetanus booster within 10 years. - Influenza: Given elsewhere - Pneumovax: Not applicable - Prevnar: Not applicable - COVID: Refused - HPV: Not applicable - Shingrix vaccine: Not applicable  SCREENING: -Mammogram: Up to date  - Colonoscopy: Up to date   PATIENT COUNSELING:   Advised to take 1 mg of folate supplement per day if capable of pregnancy.   Sexuality: Discussed sexually transmitted diseases, partner selection, use of condoms, avoidance of unintended pregnancy  and contraceptive alternatives.   Advised to avoid cigarette smoking.  I discussed with the patient that most people either abstain from alcohol or drink within safe limits (<=14/week and <=4 drinks/occasion for males, <=7/weeks and <= 3 drinks/occasion for females) and that the risk for alcohol disorders and other health effects rises proportionally with the number of drinks per week and how often a drinker exceeds daily limits.  Discussed cessation/primary prevention of drug use and availability of treatment for abuse.   Diet: Encouraged to adjust caloric intake to maintain  or achieve ideal body weight, to reduce intake of dietary saturated fat and total fat, to limit sodium intake by avoiding high sodium foods and not adding table salt, and to maintain adequate dietary potassium and calcium preferably from fresh fruits, vegetables, and low-fat dairy products.    stressed the importance of regular exercise  Injury prevention: Discussed safety belts, safety helmets, smoke detector, smoking near bedding or upholstery.   Dental health: Discussed importance of regular tooth brushing, flossing, and dental visits.    NEXT PREVENTATIVE PHYSICAL DUE IN  1 YEAR. Return in about 1 year (around 02/13/2023) for Physcial .

## 2022-02-13 ENCOUNTER — Encounter: Payer: Self-pay | Admitting: Family Medicine

## 2022-02-13 LAB — CBC WITH DIFFERENTIAL/PLATELET
Basophils Absolute: 0.1 10*3/uL (ref 0.0–0.2)
Basos: 1 %
EOS (ABSOLUTE): 0.2 10*3/uL (ref 0.0–0.4)
Eos: 4 %
Hematocrit: 33.7 % — ABNORMAL LOW (ref 34.0–46.6)
Hemoglobin: 10.7 g/dL — ABNORMAL LOW (ref 11.1–15.9)
Immature Grans (Abs): 0 10*3/uL (ref 0.0–0.1)
Immature Granulocytes: 0 %
Lymphocytes Absolute: 2.1 10*3/uL (ref 0.7–3.1)
Lymphs: 40 %
MCH: 23.3 pg — ABNORMAL LOW (ref 26.6–33.0)
MCHC: 31.8 g/dL (ref 31.5–35.7)
MCV: 73 fL — ABNORMAL LOW (ref 79–97)
Monocytes Absolute: 0.3 10*3/uL (ref 0.1–0.9)
Monocytes: 7 %
Neutrophils Absolute: 2.5 10*3/uL (ref 1.4–7.0)
Neutrophils: 48 %
Platelets: 350 10*3/uL (ref 150–450)
RBC: 4.59 x10E6/uL (ref 3.77–5.28)
RDW: 14.2 % (ref 11.7–15.4)
WBC: 5.2 10*3/uL (ref 3.4–10.8)

## 2022-02-13 LAB — COMPREHENSIVE METABOLIC PANEL
ALT: 10 IU/L (ref 0–32)
AST: 17 IU/L (ref 0–40)
Albumin/Globulin Ratio: 1.6 (ref 1.2–2.2)
Albumin: 4.4 g/dL (ref 3.9–4.9)
Alkaline Phosphatase: 55 IU/L (ref 44–121)
BUN/Creatinine Ratio: 12 (ref 9–23)
BUN: 8 mg/dL (ref 6–24)
Bilirubin Total: 0.2 mg/dL (ref 0.0–1.2)
CO2: 23 mmol/L (ref 20–29)
Calcium: 9.6 mg/dL (ref 8.7–10.2)
Chloride: 105 mmol/L (ref 96–106)
Creatinine, Ser: 0.67 mg/dL (ref 0.57–1.00)
Globulin, Total: 2.7 g/dL (ref 1.5–4.5)
Glucose: 91 mg/dL (ref 70–99)
Potassium: 4.1 mmol/L (ref 3.5–5.2)
Sodium: 142 mmol/L (ref 134–144)
Total Protein: 7.1 g/dL (ref 6.0–8.5)
eGFR: 110 mL/min/{1.73_m2} (ref >59)

## 2022-02-13 LAB — LIPID PANEL W/O CHOL/HDL RATIO
Cholesterol, Total: 165 mg/dL (ref 100–199)
HDL: 80 mg/dL (ref >39)
LDL Chol Calc (NIH): 74 mg/dL (ref 0–99)
Triglycerides: 54 mg/dL (ref 0–149)
VLDL Cholesterol Cal: 11 mg/dL (ref 5–40)

## 2022-02-13 LAB — HEPATITIS C ANTIBODY: Hep C Virus Ab: NONREACTIVE

## 2022-02-13 LAB — TSH: TSH: 1.31 u[IU]/mL (ref 0.450–4.500)

## 2022-02-13 LAB — HIV ANTIBODY (ROUTINE TESTING W REFLEX): HIV Screen 4th Generation wRfx: NONREACTIVE

## 2022-02-24 ENCOUNTER — Ambulatory Visit
Admission: RE | Admit: 2022-02-24 | Discharge: 2022-02-24 | Disposition: A | Discharge status: Home / Self Care | Payer: BC Managed Care – PPO | Source: Ambulatory Visit | Attending: Family Medicine | Admitting: Family Medicine

## 2022-02-24 ENCOUNTER — Ambulatory Visit
Admission: RE | Admit: 2022-02-24 | Discharge: 2022-02-24 | Disposition: A | Discharge status: Home / Self Care | Payer: BC Managed Care – PPO | Attending: Family Medicine | Admitting: Family Medicine

## 2022-02-24 DIAGNOSIS — R0602 Shortness of breath: Secondary | ICD-10-CM | POA: Diagnosis present

## 2022-02-24 DIAGNOSIS — M25551 Pain in right hip: Secondary | ICD-10-CM | POA: Diagnosis present

## 2022-04-27 ENCOUNTER — Encounter: Payer: Self-pay | Admitting: Family Medicine

## 2022-04-27 ENCOUNTER — Ambulatory Visit: Payer: BC Managed Care – PPO | Admitting: Family Medicine

## 2022-04-27 VITALS — BP 111/72 | HR 81 | Temp 98.6°F | Ht 62.0 in | Wt 125.4 lb

## 2022-04-27 DIAGNOSIS — R0602 Shortness of breath: Secondary | ICD-10-CM

## 2022-04-27 DIAGNOSIS — M546 Pain in thoracic spine: Secondary | ICD-10-CM | POA: Diagnosis not present

## 2022-04-27 MED ORDER — MELOXICAM 15 MG PO TABS: 15.0000 mg | ORAL_TABLET | Freq: Every day | ORAL | 2 refills | Status: DC

## 2022-04-27 NOTE — Progress Notes (Signed)
BP 111/72   Pulse 81   Temp 98.6 F (37 C) (Oral)   Ht '5\' 2"'$  (1.575 m)   Wt 125 lb 6.4 oz (56.9 kg)   SpO2 99%   BMI 22.94 kg/m    Subjective:    Patient ID: Jody Paul, female    DOB: 22-Jan-1976, 46 y.o.   MRN: [id number]  HPI: Jody Paul is a 46 y.o. female  Chief Complaint  Patient presents with   Chest Pain    Patient is requesting referrals for MRI and CT of Chest. Patient says she is still having the same issues as she was seen for previously.    SHORTNESS OF BREATH Duration: 2020 Onset: sudden Description of breathing discomfort: feels like something is stuck in her lungs, feels like she can't take a deep breath because of her gut Severity: moderate Episode duration: 25-30 minutes Frequency: several times a week Related to exertion: no Cough: yes productive Chest tightness: yes Wheezing: yes Fevers: no Chest pain: no Palpitations: yes  Nausea: no Diaphoresis: yes Deconditioning: no Status: worse Aggravating factors: exercise or eating Alleviating factors: inhalers- but they make her heart race Treatments attempted: inhalers, steam  BACK PAIN Duration: chronic, worse in the past few months Mechanism of injury: unknown Location: mildline midback Onset: sudden Severity: severe Quality: sharp and shooting Frequency: intermittent Radiation: into her lower back, nothing into her legs Aggravating factors: nothing Alleviating factors: nothing Status: worse Treatments attempted: heating pad, voltaren  Relief with NSAIDs?: No NSAIDs Taken Nighttime pain:  yes Paresthesias / decreased sensation:  no Bowel / bladder incontinence:  no Fevers:  no Dysuria / urinary frequency:  no   Relevant past medical, surgical, family and social history reviewed and updated as indicated. Interim medical history since our last visit reviewed. Allergies and medications reviewed and updated.  Review of Systems  Constitutional: Negative.   HENT: Negative.     Respiratory:  Positive for chest tightness, shortness of breath and wheezing. Negative for apnea, cough, choking and stridor.   Cardiovascular: Negative.   Gastrointestinal: Negative.   Musculoskeletal: Negative.   Neurological: Negative.   Psychiatric/Behavioral: Negative.      Per HPI unless specifically indicated above     Objective:    BP 111/72   Pulse 81   Temp 98.6 F (37 C) (Oral)   Ht '5\' 2"'$  (1.575 m)   Wt 125 lb 6.4 oz (56.9 kg)   SpO2 99%   BMI 22.94 kg/m   Wt Readings from Last 3 Encounters:  04/27/22 125 lb 6.4 oz (56.9 kg)  02/12/22 123 lb 6.4 oz (56 kg)  01/27/22 123 lb 14.4 oz (56.2 kg)    Physical Exam Vitals and nursing note reviewed.  Constitutional:      General: She is not in acute distress.    Appearance: Normal appearance. She is well-developed and normal weight. She is not ill-appearing, toxic-appearing or diaphoretic.  HENT:     Head: Normocephalic and atraumatic.     Right Ear: External ear normal.     Left Ear: External ear normal.     Nose: Nose normal.     Mouth/Throat:     Mouth: Mucous membranes are moist.     Pharynx: Oropharynx is clear.  Eyes:     General: No scleral icterus.       Right eye: No discharge.        Left eye: No discharge.     Extraocular Movements: Extraocular movements intact.  Conjunctiva/sclera: Conjunctivae normal.     Pupils: Pupils are equal, round, and reactive to light.  Cardiovascular:     Rate and Rhythm: Normal rate and regular rhythm.     Pulses: Normal pulses.     Heart sounds: Normal heart sounds. No murmur heard.    No friction rub. No gallop.  Pulmonary:     Effort: Pulmonary effort is normal. No respiratory distress.     Breath sounds: Normal breath sounds. No stridor. No wheezing, rhonchi or rales.  Chest:     Chest wall: No tenderness.  Musculoskeletal:        General: Normal range of motion.     Cervical back: Normal range of motion and neck supple.     Comments: Point tenderness at TL  junction  Skin:    General: Skin is warm and dry.     Capillary Refill: Capillary refill takes less than 2 seconds.     Coloration: Skin is not jaundiced or pale.     Findings: No bruising, erythema, lesion or rash.  Neurological:     General: No focal deficit present.     Mental Status: She is alert and oriented to person, place, and time. Mental status is at baseline.  Psychiatric:        Mood and Affect: Mood normal.        Behavior: Behavior normal.        Thought Content: Thought content normal.        Judgment: Judgment normal.     Results for orders placed or performed in visit on 02/17/22  HM PAP SMEAR  Result Value Ref Range   HM Pap smear see report scanned into chart       Assessment & Plan:   Problem List Items Addressed This Visit   None Visit Diagnoses     Acute midline thoracic back pain    -  Primary   Will start stretches and meloxicam. Check x-rays. Await results.   Relevant Medications   meloxicam (MOBIC) 15 MG tablet   Other Relevant Orders   CT Chest Wo Contrast   DG Thoracic Spine W/Swimmers   DG Lumbar Spine Complete   SOB (shortness of breath)       Chronic and worsening. Will obtain CT chest and refer to pulmolonogy. Call with any concerns. Follow up 1 month.   Relevant Orders   QuantiFERON-TB Gold Plus   Ambulatory referral to Pulmonology   CT Chest Wo Contrast        Follow up plan: Return in about 4 weeks (around 05/25/2022).

## 2022-04-30 LAB — QUANTIFERON-TB GOLD PLUS
QuantiFERON Mitogen Value: >10 IU/mL
QuantiFERON Nil Value: 1.08 IU/mL
QuantiFERON TB1 Ag Value: 0.5 IU/mL
QuantiFERON TB2 Ag Value: 0.44 IU/mL
QuantiFERON-TB Gold Plus: NEGATIVE

## 2022-05-06 ENCOUNTER — Ambulatory Visit
Admission: RE | Admit: 2022-05-06 | Discharge: 2022-05-06 | Disposition: A | Discharge status: Home / Self Care | Payer: BC Managed Care – PPO | Source: Ambulatory Visit | Attending: Family Medicine | Admitting: Family Medicine

## 2022-05-06 DIAGNOSIS — M546 Pain in thoracic spine: Secondary | ICD-10-CM

## 2022-05-06 DIAGNOSIS — R0602 Shortness of breath: Secondary | ICD-10-CM | POA: Diagnosis present

## 2022-05-29 ENCOUNTER — Encounter: Payer: Self-pay | Admitting: Family Medicine

## 2022-05-29 NOTE — Telephone Encounter (Signed)
Appt. MRI has not been ordered, was supposed to follow up in a month

## 2022-05-29 NOTE — Telephone Encounter (Signed)
Called patient to schedule an appointment. I was unable to leave a vm due to box being full

## 2022-06-08 ENCOUNTER — Encounter: Payer: Self-pay | Admitting: Family Medicine

## 2022-06-08 ENCOUNTER — Ambulatory Visit (INDEPENDENT_AMBULATORY_CARE_PROVIDER_SITE_OTHER): Payer: BC Managed Care – PPO | Admitting: Family Medicine

## 2022-06-08 VITALS — BP 100/64 | HR 62 | Temp 98.1°F | Ht 62.0 in | Wt 123.6 lb

## 2022-06-08 DIAGNOSIS — M545 Low back pain, unspecified: Secondary | ICD-10-CM

## 2022-06-08 DIAGNOSIS — G8929 Other chronic pain: Secondary | ICD-10-CM

## 2022-06-08 LAB — URINALYSIS, ROUTINE W REFLEX MICROSCOPIC
Bilirubin, UA: NEGATIVE
Glucose, UA: NEGATIVE
Ketones, UA: NEGATIVE
Leukocytes,UA: NEGATIVE
Nitrite, UA: NEGATIVE
Protein,UA: NEGATIVE
RBC, UA: NEGATIVE
Specific Gravity, UA: 1.025 (ref 1.005–1.030)
Urobilinogen, Ur: 0.2 mg/dL (ref 0.2–1.0)
pH, UA: 6.5 (ref 5.0–7.5)

## 2022-06-08 NOTE — Progress Notes (Signed)
BP 100/64   Pulse 62   Temp 98.1 F (36.7 C) (Oral)   Ht '5\' 2"'$  (1.575 m)   Wt 123 lb 9.6 oz (56.1 kg)   BMI 22.61 kg/m    Subjective:    Patient ID: Jody Paul, female    DOB: 14-Sep-1975, 47 y.o.   MRN: [id number]  HPI: Jody Paul is a 48 y.o. female  Chief Complaint  Patient presents with   Back Pain   BACK PAIN Duration: chronic Mechanism of injury: unknown Location: R>L, bilateral, low back, and upper back Onset: gradual Severity: 3/10 Quality: stabbing, throbbing Frequency: intermittent Radiation:  into her R hip Aggravating factors: laying down at night Alleviating factors: stretching Status: better Treatments attempted: meloxicam, stretching  Relief with NSAIDs?: did not take consistently Nighttime pain:  yes Paresthesias / decreased sensation:  no Bowel / bladder incontinence:  no Fevers:  no Dysuria / urinary frequency:  no  Relevant past medical, surgical, family and social history reviewed and updated as indicated. Interim medical history since our last visit reviewed. Allergies and medications reviewed and updated.  Review of Systems  Constitutional: Negative.   Cardiovascular: Negative.   Gastrointestinal: Negative.   Genitourinary:  Positive for urgency. Negative for decreased urine volume, difficulty urinating, dyspareunia, dysuria, enuresis, flank pain, frequency, genital sores, hematuria, menstrual problem, pelvic pain, vaginal bleeding, vaginal discharge and vaginal pain.  Musculoskeletal:  Positive for back pain. Negative for arthralgias, gait problem, joint swelling, myalgias, neck pain and neck stiffness.  Skin: Negative.   Neurological: Negative.   Psychiatric/Behavioral: Negative.      Per HPI unless specifically indicated above     Objective:    BP 100/64   Pulse 62   Temp 98.1 F (36.7 C) (Oral)   Ht '5\' 2"'$  (1.575 m)   Wt 123 lb 9.6 oz (56.1 kg)   BMI 22.61 kg/m   Wt Readings from Last 3 Encounters:  06/08/22  123 lb 9.6 oz (56.1 kg)  04/27/22 125 lb 6.4 oz (56.9 kg)  02/12/22 123 lb 6.4 oz (56 kg)    Physical Exam Vitals and nursing note reviewed.  Constitutional:      General: She is not in acute distress.    Appearance: Normal appearance. She is normal weight. She is not ill-appearing, toxic-appearing or diaphoretic.  HENT:     Head: Normocephalic and atraumatic.     Right Ear: External ear normal.     Left Ear: External ear normal.     Nose: Nose normal.     Mouth/Throat:     Mouth: Mucous membranes are moist.     Pharynx: Oropharynx is clear.  Eyes:     General: No scleral icterus.       Right eye: No discharge.        Left eye: No discharge.     Extraocular Movements: Extraocular movements intact.     Conjunctiva/sclera: Conjunctivae normal.     Pupils: Pupils are equal, round, and reactive to light.  Cardiovascular:     Rate and Rhythm: Normal rate and regular rhythm.     Pulses: Normal pulses.     Heart sounds: Normal heart sounds. No murmur heard.    No friction rub. No gallop.  Pulmonary:     Effort: Pulmonary effort is normal. No respiratory distress.     Breath sounds: Normal breath sounds. No stridor. No wheezing, rhonchi or rales.  Chest:     Chest wall: No tenderness.  Abdominal:  General: Abdomen is flat. Bowel sounds are normal. There is no distension.     Palpations: Abdomen is soft. There is no mass.     Tenderness: There is no abdominal tenderness. There is right CVA tenderness. There is no left CVA tenderness, guarding or rebound.     Hernia: No hernia is present.  Musculoskeletal:        General: Normal range of motion.     Cervical back: Normal range of motion and neck supple.  Skin:    General: Skin is warm and dry.     Capillary Refill: Capillary refill takes less than 2 seconds.     Coloration: Skin is not jaundiced or pale.     Findings: No bruising, erythema, lesion or rash.  Neurological:     General: No focal deficit present.     Mental  Status: She is alert and oriented to person, place, and time. Mental status is at baseline.  Psychiatric:        Mood and Affect: Mood normal.        Behavior: Behavior normal.        Thought Content: Thought content normal.        Judgment: Judgment normal.     Results for orders placed or performed in visit on 06/08/22  Urinalysis, Routine w reflex microscopic  Result Value Ref Range   Specific Gravity, UA 1.025 1.005 - 1.030   pH, UA 6.5 5.0 - 7.5   Color, UA Yellow Yellow   Appearance Ur Cloudy (A) Clear   Leukocytes,UA Negative Negative   Protein,UA Negative Negative/Trace   Glucose, UA Negative Negative   Ketones, UA Negative Negative   RBC, UA Negative Negative   Bilirubin, UA Negative Negative   Urobilinogen, Ur 0.2 0.2 - 1.0 mg/dL   Nitrite, UA Negative Negative   Microscopic Examination Comment       Assessment & Plan:   Problem List Items Addressed This Visit   None Visit Diagnoses     Chronic right-sided low back pain without sciatica    -  Primary   Upper back pain is better, continues with R low back pain. X-rays normal. Will check UA if + blood, consider CT to r/o stone. Refer for PT.   Relevant Orders   Urinalysis, Routine w reflex microscopic (Completed)   Ambulatory referral to Physical Therapy        Follow up plan: Return in about 4 weeks (around 07/06/2022).

## 2022-07-09 ENCOUNTER — Ambulatory Visit: Payer: BC Managed Care – PPO | Admitting: Family Medicine

## 2022-09-29 ENCOUNTER — Ambulatory Visit
Admission: EM | Admit: 2022-09-29 | Discharge: 2022-09-29 | Disposition: A | Discharge status: Home / Self Care | Payer: BC Managed Care – PPO | Attending: Urgent Care | Admitting: Urgent Care

## 2022-09-29 DIAGNOSIS — R079 Chest pain, unspecified: Secondary | ICD-10-CM

## 2022-09-29 NOTE — ED Notes (Signed)
Patient is being discharged from the Urgent Care and sent to the Emergency Department via POV . Per Alycia Rossetti, NP, patient is in need of higher level of care due to chest pain. Patient is aware and verbalizes understanding of plan of care.  Vitals:   09/29/22 1931  BP: (!) 139/92  Pulse: 79  Resp: 18  Temp: 98.3 F (36.8 C)  SpO2: 100%

## 2022-09-29 NOTE — ED Triage Notes (Addendum)
Patient presents to UC for chest pain, SOB, dizziness, tachycardia since 1530 today. Checked her BP, states one arm read in 140's and the other 110's, HR reported to be in the 120's. States her baseline BP falls in the 90's. Also reports flank pain, urinary frequency, urinary retention, and bladder spasms since this morning. States she has been drinking lots of fluids and cranberry juice.

## 2022-09-29 NOTE — ED Provider Notes (Signed)
[facility]    CSN: [id number] Arrival date & time: 09/29/22  1921      History   Chief Complaint Chief Complaint  Patient presents with   Chest Pain   Shortness of Breath   Irregular Heart Beat    HPI Jody Paul is a 47 y.o. female.   HPI  47 year old female with a past medical history significant for asthma, anemia, migraine headaches, and cervical stenosis presents for evaluation of substernal chest pain that radiates to the left chest, irregular heartbeat, shortness breath, and central chest pressure that started today while at rest at around 330.  She states that she is still having the chest pressure.  She did have a single episode of dizziness as well as some intermittent nausea and headache.  She denies any sweating.  She has no personal history of heart issues.  Her mother died of a CVA.  History reviewed. No pertinent past medical history.  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      Home Medications    Prior to Admission medications   Medication Sig Start Date End Date Taking? Authorizing Provider  albuterol (VENTOLIN HFA) 108 (90 Base) MCG/ACT inhaler Inhale 2 puffs into the lungs every 6 (six)  hours as needed for wheezing or shortness of breath. 07/23/21   Johnson, Megan P, DO  budesonide-formoterol (SYMBICORT) 160-4.5 MCG/ACT inhaler Inhale into the lungs. 05/13/22 05/13/23  [provider]  Calcium Carbonate-Vit D-Min (CALCIUM 1200 PO) Take by mouth.    [provider]  diclofenac Sodium (VOLTAREN) 1 % GEL Apply 4 g topically 4 (four) times daily. 08/06/21   Johnson, Megan P, DO  ELDERBERRY PO Take by mouth.    [provider]  Flaxseed, Linseed, (FLAX SEED OIL) 1000 MG CAPS Take by mouth.    [provider]  Multiple Vitamin (MULTIVITAMIN) LIQD Take 5 mLs by mouth daily.    [provider]  Sodium Chloride, Inhalant, 7 % NEBU Inhale into the lungs. 05/13/22   [provider]    Family History Family History  Problem Relation Age of Onset   Stroke Mother    Cancer Mother        lung cancer   Cancer Maternal Grandfather     Social History Social History   Tobacco Use   Smoking status: Never   Smokeless tobacco: Never  Vaping Use   Vaping Use: Never used  Substance Use Topics   Alcohol use: Yes    Alcohol/week: 1.0 standard drink of alcohol    Types: 1 Glasses of wine per week    Comment: occasionally   Drug use: No     Allergies  Patient has no known allergies.   Review of Systems Review of Systems  Constitutional:  Negative for diaphoresis.  Respiratory:  Positive for shortness of breath.   Cardiovascular:  Positive for chest pain.  Gastrointestinal:  Positive for nausea.  Neurological:  Positive for dizziness and headaches.     Physical Exam Triage Vital Signs ED Triage Vitals [09/29/22 1931]  Enc Vitals Group     BP (!) 139/92     Pulse Rate 79     Resp 18     Temp 98.3 F (36.8 C)     Temp Source Oral     SpO2 100 %     Weight      Height      Head Circumference      Peak Flow      Pain Score      Pain Loc      Pain Edu?      Excl. in GC?    No data found.  Updated Vital  Signs BP (!) 139/92 (BP Location: Right Arm)   Pulse 79   Temp 98.3 F (36.8 C) (Oral)   Resp 18   LMP 09/16/2022 (Approximate)   SpO2 100%   Visual Acuity Right Eye Distance:   Left Eye Distance:   Bilateral Distance:    Right Eye Near:   Left Eye Near:    Bilateral Near:     Physical Exam Vitals and nursing note reviewed.  Constitutional:      Appearance: Normal appearance. She is not ill-appearing.  HENT:     Head: Normocephalic and atraumatic.  Cardiovascular:     Rate and Rhythm: Normal rate.     Pulses: Normal pulses.     Heart sounds: Normal heart sounds. No murmur heard.    No friction rub. No gallop.  Pulmonary:     Effort: Pulmonary effort is normal.     Breath sounds: Normal breath sounds. No wheezing, rhonchi or rales.  Skin:    General: Skin is warm and dry.     Capillary Refill: Capillary refill takes less than 2 seconds.     Findings: No erythema or rash.  Neurological:     General: No focal deficit present.     Mental Status: She is alert and oriented to person, place, and time.      UC Treatments / Results  Labs (all labs ordered are listed, but only abnormal results are displayed) Labs Reviewed - No data to display   EKG Normal sinus rhythm with ventricular rate of 66 bpm PR normal 154 ms QRS duration 70 ms QT/QTc 362/379 ms No ST or T wave abnormalities.  Radiology No results found.  Procedures Procedures (including critical care time)  Medications Ordered in UC Medications - No data to display  Initial Impression / Assessment and Plan / UC Course  I have reviewed the triage vital signs and the nursing notes.  Pertinent labs & imaging results that were available during my care of the patient were reviewed by me and considered in my medical decision making (see chart for details).   Patient is a pleasant, nontoxic-appearing 47 year old female presenting for evaluation of central chest pressure that is ongoing and was associated  with shortness of breath, chest pain, and irregular heartbeat.  The remainder the symptoms have resolved.  She also endorses that she had a single episode of dizziness as well as some nausea.  She does endorse a headache.  EKG collected at triage shows normal sinus  rhythm with no appreciable ST or T wave abnormalities.  There are no other tracings available for comparison in epic.  Given patient's cluster of symptoms I am concerned this could be cardiac.  I feel that she should be evaluated in the emergency department.  Her vital signs show mild elevation of blood pressure with 139/92 but her heart rate is normal at 79, respirate 18, O2 100% on room air.  I do feel this patient is safe based on her current vital signs and EKG to travel via POV.  She has elected to go to [facility].   Final Clinical Impressions(s) / UC Diagnoses   Final diagnoses:  Chest pain, unspecified type     Discharge Instructions      Please go to the emergency department at [facility] for evaluation of your ongoing chest pressure.  Please go now.     ED Prescriptions   None    PDMP not reviewed this encounter.   Becky Augusta, NP 09/29/22 1949

## 2022-09-29 NOTE — Discharge Instructions (Addendum)
Please go to the emergency department at [facility] for evaluation of your ongoing chest pressure.  Please go now.

## 2022-10-01 ENCOUNTER — Encounter: Payer: Self-pay | Admitting: Nurse Practitioner

## 2022-10-01 ENCOUNTER — Ambulatory Visit: Payer: BC Managed Care – PPO | Admitting: Nurse Practitioner

## 2022-10-01 VITALS — BP 110/73 | HR 70 | Temp 98.9°F | Wt 123.4 lb

## 2022-10-01 DIAGNOSIS — B9689 Other specified bacterial agents as the cause of diseases classified elsewhere: Secondary | ICD-10-CM

## 2022-10-01 DIAGNOSIS — M545 Low back pain, unspecified: Secondary | ICD-10-CM | POA: Diagnosis not present

## 2022-10-01 DIAGNOSIS — R1030 Lower abdominal pain, unspecified: Secondary | ICD-10-CM

## 2022-10-01 DIAGNOSIS — J029 Acute pharyngitis, unspecified: Secondary | ICD-10-CM

## 2022-10-01 DIAGNOSIS — N760 Acute vaginitis: Secondary | ICD-10-CM | POA: Diagnosis not present

## 2022-10-01 LAB — WET PREP FOR TRICH, YEAST, CLUE
Clue Cell Exam: POSITIVE — AB
Trichomonas Exam: NEGATIVE
Yeast Exam: NEGATIVE

## 2022-10-01 LAB — URINALYSIS, ROUTINE W REFLEX MICROSCOPIC
Bilirubin, UA: NEGATIVE
Glucose, UA: NEGATIVE
Ketones, UA: NEGATIVE
Leukocytes,UA: NEGATIVE
Nitrite, UA: NEGATIVE
Protein,UA: NEGATIVE
RBC, UA: NEGATIVE
Specific Gravity, UA: 1.02 (ref 1.005–1.030)
Urobilinogen, Ur: 0.2 mg/dL (ref 0.2–1.0)
pH, UA: 6 (ref 5.0–7.5)

## 2022-10-01 LAB — RAPID STREP SCREEN (MED CTR MEBANE ONLY): Strep Gp A Ag, IA W/Reflex: NEGATIVE

## 2022-10-01 LAB — CULTURE, GROUP A STREP

## 2022-10-01 MED ORDER — METRONIDAZOLE 1.3 % VA GEL: 1.0000 | Freq: Every day | VAGINAL | 0 refills | Status: DC

## 2022-10-01 NOTE — Progress Notes (Signed)
BP 110/73   Pulse 70   Temp 98.9 F (37.2 C) (Oral)   Wt 123 lb 6.4 oz (56 kg)   LMP 09/18/2022 (Exact Date)   SpO2 99%   BMI 22.57 kg/m    Subjective:    Patient ID: Jody Paul, female    DOB: 1976-05-02, 47 y.o.   MRN: [id number]  HPI: Jody Paul is a 47 y.o. female  Chief Complaint  Patient presents with   Vaginal Itching    Pt states that the symptoms has been on going for a week burning in the back of throat and lower back pain also frequent unrination   VAGINAL DISCHARGE Duration: days Discharge description: white  Pruritus: no Dysuria: yes Malodorous: no Urinary frequency: yes Fevers: no Abdominal pain: yes  Sexual activity: monogamous History of sexually transmitted diseases: no Recent antibiotic use: no Context: recurrent BV  Treatments attempted: none  Patient states she has been having pain and bladder spasms on and off for about a week.  She has been trying cranberry juice without improvement.  UA neg.  Denies any bleeding.  Relevant past medical, surgical, family and social history reviewed and updated as indicated. Interim medical history since our last visit reviewed. Allergies and medications reviewed and updated.  Review of Systems  Genitourinary:  Positive for dysuria and pelvic pain.  Musculoskeletal:  Positive for back pain.    Per HPI unless specifically indicated above     Objective:    BP 110/73   Pulse 70   Temp 98.9 F (37.2 C) (Oral)   Wt 123 lb 6.4 oz (56 kg)   LMP 09/18/2022 (Exact Date)   SpO2 99%   BMI 22.57 kg/m   Wt Readings from Last 3 Encounters:  10/01/22 123 lb 6.4 oz (56 kg)  06/08/22 123 lb 9.6 oz (56.1 kg)  04/27/22 125 lb 6.4 oz (56.9 kg)    Physical Exam Vitals and nursing note reviewed.  Constitutional:      General: She is not in acute distress.    Appearance: Normal appearance. She is normal weight. She is not ill-appearing, toxic-appearing or diaphoretic.  HENT:     Head: Normocephalic.      Right Ear: External ear normal.     Left Ear: External ear normal.     Nose: Nose normal.     Mouth/Throat:     Mouth: Mucous membranes are moist.     Pharynx: Oropharynx is clear.  Eyes:     General:        Right eye: No discharge.        Left eye: No discharge.     Extraocular Movements: Extraocular movements intact.     Conjunctiva/sclera: Conjunctivae normal.     Pupils: Pupils are equal, round, and reactive to light.  Cardiovascular:     Rate and Rhythm: Normal rate and regular rhythm.     Heart sounds: No murmur heard. Pulmonary:     Effort: Pulmonary effort is normal. No respiratory distress.     Breath sounds: Normal breath sounds. No wheezing or rales.  Abdominal:     General: Abdomen is flat. Bowel sounds are normal. There is no distension.     Palpations: Abdomen is soft.     Tenderness: There is abdominal tenderness in the right lower quadrant and suprapubic area. There is no right CVA tenderness, left CVA tenderness or guarding.    Musculoskeletal:     Cervical back: Normal range of motion and neck  supple.  Skin:    General: Skin is warm and dry.     Capillary Refill: Capillary refill takes less than 2 seconds.  Neurological:     General: No focal deficit present.     Mental Status: She is alert and oriented to person, place, and time. Mental status is at baseline.  Psychiatric:        Mood and Affect: Mood normal.        Behavior: Behavior normal.        Thought Content: Thought content normal.        Judgment: Judgment normal.     Results for orders placed or performed in visit on 06/08/22  Urinalysis, Routine w reflex microscopic  Result Value Ref Range   Specific Gravity, UA 1.025 1.005 - 1.030   pH, UA 6.5 5.0 - 7.5   Color, UA Yellow Yellow   Appearance Ur Cloudy (A) Clear   Leukocytes,UA Negative Negative   Protein,UA Negative Negative/Trace   Glucose, UA Negative Negative   Ketones, UA Negative Negative   RBC, UA Negative Negative    Bilirubin, UA Negative Negative   Urobilinogen, Ur 0.2 0.2 - 1.0 mg/dL   Nitrite, UA Negative Negative   Microscopic Examination Comment       Assessment & Plan:   Problem List Items Addressed This Visit   None Visit Diagnoses     BV (bacterial vaginosis)    -  Primary   Will treat with Flagyl. If not improved, can do a course of clindamycin.   Lower abdominal pain       Ongoing symptoms.  Has history of fibroids. Tenderness on RL pelvic area.  Will order [place] for evaluation.   Relevant Orders   US Pelvic Complete With Transvaginal   Acute bilateral low back pain without sciatica       Relevant Orders   Rapid Strep screen([facility])   HIV Antibody (routine testing w rflx)   RPR   Hepatitis C antibody   Chlamydia/Gonococcus/Trichomonas, NAA   HSV 1 and 2 Ab, IgG   WET PREP FOR TRICH, YEAST, CLUE   Urinalysis, Routine w reflex microscopic   Sore throat       Relevant Orders   Rapid Strep screen([facility])        Follow up plan: Return if symptoms worsen or fail to improve.

## 2022-10-01 NOTE — Progress Notes (Signed)
Results discussed with patient during visit.

## 2022-10-02 LAB — RPR: RPR Ser Ql: NONREACTIVE

## 2022-10-02 LAB — HSV 1 AND 2 AB, IGG
HSV 1 Glycoprotein G Ab, IgG: >62.2 index — ABNORMAL HIGH (ref 0.00–0.90)
HSV 2 IgG, Type Spec: <0.91 index (ref 0.00–0.90)

## 2022-10-02 LAB — HIV ANTIBODY (ROUTINE TESTING W REFLEX): HIV Screen 4th Generation wRfx: NONREACTIVE

## 2022-10-02 LAB — HEPATITIS C ANTIBODY: Hep C Virus Ab: NONREACTIVE

## 2022-10-02 NOTE — Progress Notes (Signed)
HI Jody Paul.  Your lab work shows that you are negative for HIV, Hepatitis C and Syphilis.  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

## 2022-10-04 ENCOUNTER — Encounter: Payer: Self-pay | Admitting: Nurse Practitioner

## 2022-10-04 LAB — CHLAMYDIA/GONOCOCCUS/TRICHOMONAS, NAA
Chlamydia by NAA: NEGATIVE
Gonococcus by NAA: NEGATIVE
Trich vag by NAA: NEGATIVE

## 2022-10-05 NOTE — Progress Notes (Signed)
Hi Jody Paul. Your STI testing was negative.

## 2022-10-16 ENCOUNTER — Ambulatory Visit
Admission: RE | Admit: 2022-10-16 | Discharge: 2022-10-16 | Disposition: A | Discharge status: Home / Self Care | Payer: BC Managed Care – PPO | Source: Ambulatory Visit | Attending: Nurse Practitioner | Admitting: Nurse Practitioner

## 2022-10-16 DIAGNOSIS — R1030 Lower abdominal pain, unspecified: Secondary | ICD-10-CM | POA: Insufficient documentation

## 2022-10-21 NOTE — Progress Notes (Signed)
HI Jody Paul.  Your ultrasound does show that you have multiple fibroids with the largest on the right side.  If your pain is persistent I would recommend seeing GYN for further evaluation.

## 2022-10-26 LAB — HM MAMMOGRAPHY

## 2022-10-28 ENCOUNTER — Encounter: Payer: Self-pay | Admitting: Nurse Practitioner

## 2022-10-29 ENCOUNTER — Ambulatory Visit: Payer: Self-pay

## 2022-10-29 NOTE — Telephone Encounter (Signed)
     Chief Complaint: Mid- low back pain. States kidney stones seen on x-ray. Symptoms: Pain, cloudy dark urine Frequency: 1-2 weeks ago Pertinent Negatives: Patient denies fever Disposition: [] ED /[] Urgent Care (no appt availability in office) / [x] Appointment(In office/virtual)/ []  [facility]/ [] Home Care/ [] Refused Recommended Disposition /[] [facility]/ []  Follow-up with PCP Additional Notes:   Reason for Disposition  Blood in urine (red, pink, or tea-colored)  Answer Assessment - Initial Assessment Questions 1. ONSET: "When did the pain begin?"      1 -2 weeks ago 2. LOCATION: "Where does it hurt?" (upper, mid or lower back)     Mid-low back 3. SEVERITY: "How bad is the pain?"  (e.g., Scale 1-10; mild, moderate, or severe)   - MILD (1-3): Doesn't interfere with normal activities.    - MODERATE (4-7): Interferes with normal activities or awakens from sleep.    - SEVERE (8-10): Excruciating pain, unable to do any normal activities.      Severe 4. PATTERN: "Is the pain constant?" (e.g., yes, no; constant, intermittent)      Constant 5. RADIATION: "Does the pain shoot into your legs or somewhere else?"     No 6. CAUSE:  "What do you think is causing the back pain?"      Stones 7. BACK OVERUSE:  "Any recent lifting of heavy objects, strenuous work or exercise?"     No 8. MEDICINES: "What have you taken so far for the pain?" (e.g., nothing, acetaminophen, NSAIDS)     Ibu 9. NEUROLOGIC SYMPTOMS: "Do you have any weakness, numbness, or problems with bowel/bladder control?"     No 10. OTHER SYMPTOMS: "Do you have any other symptoms?" (e.g., fever, abdomen pain, burning with urination, blood in urine)       Cloudy urine 11. PREGNANCY: "Is there any chance you are pregnant?" "When was your last menstrual period?"       No  Protocols used: Back Pain-A-AH

## 2022-10-30 ENCOUNTER — Ambulatory Visit: Payer: BC Managed Care – PPO | Admitting: Physician Assistant

## 2022-10-30 ENCOUNTER — Encounter: Payer: Self-pay | Admitting: Physician Assistant

## 2022-10-30 VITALS — BP 116/80 | HR 80 | Temp 98.6°F | Wt 121.6 lb

## 2022-10-30 DIAGNOSIS — R1010 Upper abdominal pain, unspecified: Secondary | ICD-10-CM

## 2022-10-30 DIAGNOSIS — R109 Unspecified abdominal pain: Secondary | ICD-10-CM | POA: Diagnosis not present

## 2022-10-30 DIAGNOSIS — N200 Calculus of kidney: Secondary | ICD-10-CM | POA: Diagnosis not present

## 2022-10-30 DIAGNOSIS — N760 Acute vaginitis: Secondary | ICD-10-CM

## 2022-10-30 DIAGNOSIS — B9689 Other specified bacterial agents as the cause of diseases classified elsewhere: Secondary | ICD-10-CM

## 2022-10-30 LAB — URINALYSIS, ROUTINE W REFLEX MICROSCOPIC
Bilirubin, UA: NEGATIVE
Glucose, UA: NEGATIVE
Ketones, UA: NEGATIVE
Leukocytes,UA: NEGATIVE
Nitrite, UA: NEGATIVE
Protein,UA: NEGATIVE
RBC, UA: NEGATIVE
Specific Gravity, UA: 1.015 (ref 1.005–1.030)
Urobilinogen, Ur: 0.2 mg/dL (ref 0.2–1.0)
pH, UA: 8.5 — ABNORMAL HIGH (ref 5.0–7.5)

## 2022-10-30 LAB — WET PREP FOR TRICH, YEAST, CLUE
Clue Cell Exam: POSITIVE — AB
Trichomonas Exam: NEGATIVE
Yeast Exam: NEGATIVE

## 2022-10-30 MED ORDER — METRONIDAZOLE 500 MG PO TABS
500.0000 mg | ORAL_TABLET | Freq: Two times a day (BID) | ORAL | 0 refills | Status: DC
Start: 2022-10-30 — End: 2023-07-20

## 2022-10-30 NOTE — Progress Notes (Signed)
Your urinalysis was normal.  Your wet prep was positive for bacterial vaginosis - I have sent in Flagyl for you to take to treat this. Please take as directed. It is important to note that consuming alcohol while using this medication can cause severe nausea and vomiting so please avoid this while on the Flagyl. Let us know if you have further questions or concerns.

## 2022-10-30 NOTE — Progress Notes (Signed)
Acute Office Visit   Patient: Jody Paul   DOB: 1975/11/28   47 y.o. Female  MRN: [id number] Visit Date: 10/30/2022  Today's healthcare provider: Oswaldo Conroy Keiko Myricks, PA-C  Introduced myself to the patient as a Secondary school teacher and provided education on APPs in clinical practice.    Chief Complaint  Patient presents with  . Back Pain    Pt states that she has the pain on and off for the past few weeks    Subjective    HPI HPI     Back Pain    Additional comments: Pt states that she has the pain on and off for the past few weeks       Last edited by Andre Lefort, CMA on 10/30/2022  3:08 PM.       Flank pain   Onset: gradual  Duration: ongoing for about 2 weeks but increasing in severity  Location: bilateral flanks  Radiation: radiates to the sides toward abdomen  Pain level and character: right now 5/10  Other associated symptoms: she reports some irritation with voiding and bilateral flank pain  Interventions: acupuncture, dry needling, cupping, stretching, heat pads, cool compresses  Alleviating: nothing  Aggravating:  nothing   She reports having a CT chest that showed left sided nephrolithiasis - in [facility] record from 10/13/22   She also reports nodular formations on her upper thighs - usually in the same areas She reports soreness but denies skin color changes  She denies acupuncture, needling, or cupping to those areas She states they resolve but then return sporadically      Medications: Outpatient Medications Prior to Visit  Medication Sig  . albuterol (VENTOLIN HFA) 108 (90 Base) MCG/ACT inhaler Inhale 2 puffs into the lungs every 6 (six) hours as needed for wheezing or shortness of breath.  . budesonide-formoterol (SYMBICORT) 160-4.5 MCG/ACT inhaler Inhale into the lungs.  . Calcium Carbonate-Vit D-Min (CALCIUM 1200 PO) Take by mouth.  . diclofenac Sodium (VOLTAREN) 1 % GEL Apply 4 g topically 4 (four) times daily.  Marland Kitchen ELDERBERRY PO Take by mouth.  .  Flaxseed, Linseed, (FLAX SEED OIL) 1000 MG CAPS Take by mouth.  . metroNIDAZOLE 1.3 % GEL Place 1 Application vaginally at bedtime.  . Multiple Vitamin (MULTIVITAMIN) LIQD Take 5 mLs by mouth daily.  . Sodium Chloride, Inhalant, 7 % NEBU Inhale into the lungs.   No facility-administered medications prior to visit.    Review of Systems  Genitourinary:  Positive for dysuria and flank pain. Negative for frequency and hematuria.    {Labs  Heme  Chem  Endocrine  Serology  Results Review (optional):23779}   Objective    BP 116/80   Pulse 80   Temp 98.6 F (37 C) (Oral)   Wt 121 lb 9.6 oz (55.2 kg)   LMP 10/05/2022 (Exact Date)   SpO2 98%   BMI 22.24 kg/m  {Show previous vital signs (optional):23777}  Physical Exam Vitals reviewed.  Constitutional:      General: She is awake.     Appearance: Normal appearance. She is well-developed and well-groomed.  HENT:     Head: Normocephalic and atraumatic.  Abdominal:     General: Abdomen is flat. Bowel sounds are normal.     Palpations: Abdomen is soft.     Tenderness: There is abdominal tenderness. There is right CVA tenderness and left CVA tenderness.  Skin:    General: Skin is warm and dry.  Capillary Refill: Capillary refill takes less than 2 seconds.     Coloration: Skin is not ashen.       Neurological:     Mental Status: She is alert.  Psychiatric:        Behavior: Behavior is cooperative.      No results found for any visits on 10/30/22.  Assessment & Plan      No follow-ups on file.

## 2022-11-06 ENCOUNTER — Ambulatory Visit
Admission: RE | Admit: 2022-11-06 | Discharge: 2022-11-06 | Disposition: A | Discharge status: Home / Self Care | Payer: BC Managed Care – PPO | Source: Ambulatory Visit | Attending: Physician Assistant | Admitting: Physician Assistant

## 2022-11-06 DIAGNOSIS — N200 Calculus of kidney: Secondary | ICD-10-CM | POA: Diagnosis present

## 2022-11-06 DIAGNOSIS — R1010 Upper abdominal pain, unspecified: Secondary | ICD-10-CM | POA: Diagnosis not present

## 2022-11-11 ENCOUNTER — Encounter: Payer: Self-pay | Admitting: Physician Assistant

## 2022-11-11 NOTE — Progress Notes (Signed)
Your CT scan shows that there is a small kidney stone in your left kidney. It is 3 mm which is usually passable without intervention from Urology. I can send you a medication called Flomax to help with flushing it out if you are still having pain or we can monitor it for now if your pain as resolved after finishing the BV treatment. Let us know if you are still having pain and discomfort and if you would like the Flomax.

## 2023-02-24 ENCOUNTER — Ambulatory Visit: Payer: BC Managed Care – PPO | Admitting: Pediatrics

## 2023-03-01 ENCOUNTER — Encounter: Payer: Self-pay | Admitting: Pediatrics

## 2023-03-01 ENCOUNTER — Ambulatory Visit: Payer: BC Managed Care – PPO | Admitting: Pediatrics

## 2023-03-01 ENCOUNTER — Ambulatory Visit
Admission: RE | Admit: 2023-03-01 | Discharge: 2023-03-01 | Disposition: A | Discharge status: Home / Self Care | Payer: BC Managed Care – PPO | Attending: Pediatrics | Admitting: Pediatrics

## 2023-03-01 ENCOUNTER — Ambulatory Visit
Admission: RE | Admit: 2023-03-01 | Discharge: 2023-03-01 | Disposition: A | Discharge status: Home / Self Care | Payer: BC Managed Care – PPO | Source: Ambulatory Visit | Attending: Pediatrics | Admitting: Pediatrics

## 2023-03-01 VITALS — BP 113/74 | HR 74 | Ht 62.0 in | Wt 119.8 lb

## 2023-03-01 DIAGNOSIS — M65312 Trigger thumb, left thumb: Secondary | ICD-10-CM

## 2023-03-01 DIAGNOSIS — M25532 Pain in left wrist: Secondary | ICD-10-CM | POA: Diagnosis present

## 2023-03-01 NOTE — Patient Instructions (Addendum)
Apply voltaren gel and use ibuprofen as needed  Will re evaluate in 4-6 weeks

## 2023-03-01 NOTE — Progress Notes (Signed)
Office Visit  BP 113/74   Pulse 74   Ht 5\' 2"  (1.575 m)   Wt 119 lb 12.8 oz (54.3 kg)   LMP 02/22/2023 (Approximate)   SpO2 98%   BMI 21.91 kg/m    Subjective:    Patient ID: Jody Paul, female    DOB: April 27, 1976, 47 y.o.   MRN: [id number]  HPI: Jody Paul is a 47 y.o. female  Chief Complaint  Patient presents with   Hand Pain    Patient says for the past month, she has noticed her L Thumb has been locking on her in the middle of the night. Patient says it will lock up and cannot bend it backwards. Patient says she has tried everything and says it only feels better when she runs hot water over it.    Started a month ago, left thumb keeps locking Requires pulling, and running under warm water Has tried acupuncture, massaging, no improvement in symptoms She works as a Engineer, civil (consulting), had thumb injury in the past years ago (no prior fracture) Fine movements primarily at work on the left, not picking up heavy objects Has not had numbness or tingling, not dropping any objects  Relevant past medical, surgical, family and social history reviewed and updated as indicated. Interim medical history since our last visit reviewed. Allergies and medications reviewed and updated.  ROS per HPI unless specifically indicated above     Objective:    BP 113/74   Pulse 74   Ht 5\' 2"  (1.575 m)   Wt 119 lb 12.8 oz (54.3 kg)   LMP 02/22/2023 (Approximate)   SpO2 98%   BMI 21.91 kg/m   Wt Readings from Last 3 Encounters:  03/01/23 119 lb 12.8 oz (54.3 kg)  10/30/22 121 lb 9.6 oz (55.2 kg)  10/01/22 123 lb 6.4 oz (56 kg)     Physical Exam Constitutional:      Appearance: Normal appearance.  HENT:     Head: Normocephalic and atraumatic.  Eyes:     Pupils: Pupils are equal, round, and reactive to light.  Cardiovascular:     Rate and Rhythm: Normal rate and regular rhythm.     Pulses: Normal pulses.     Heart sounds: Normal heart sounds.  Pulmonary:     Effort: Pulmonary  effort is normal.     Breath sounds: Normal breath sounds.  Abdominal:     General: Abdomen is flat.     Palpations: Abdomen is soft.  Musculoskeletal:        General: Normal range of motion.     Right wrist: Normal.     Left wrist: Tenderness present. No swelling, deformity or effusion.     Cervical back: Normal range of motion.     Comments: Negative phalens and tinnels  Skin:    General: Skin is warm and dry.     Capillary Refill: Capillary refill takes less than 2 seconds.  Neurological:     General: No focal deficit present.     Mental Status: She is alert. Mental status is at baseline.  Psychiatric:        Mood and Affect: Mood normal.        Behavior: Behavior normal.        Assessment & Plan:  Assessment & Plan   Trigger finger of left thumb Patient presenting w s/s trigger finger of thumb. Plan on splint, NSAID and OT. Re-evaluate in 6 weeks and determine if needs injection. Reviewed natural progression  of disease. -     Ambulatory referral to Occupational Therapy  Left wrist pain Given pain associated with above, and repetitive trauma from job, patient requesting imaging. Reasonable given wrist more tender than would expect on exam.  -     DG Hand Complete Left; Future -     DG Wrist Complete Left; Future   Follow up plan: Return in about 6 weeks (around 04/12/2023) for trigger finger.  Ashtyn Freilich Howell Pringle, MD

## 2023-03-02 ENCOUNTER — Ambulatory Visit: Payer: BC Managed Care – PPO | Attending: Pediatrics | Admitting: Occupational Therapy

## 2023-03-02 DIAGNOSIS — M65312 Trigger thumb, left thumb: Secondary | ICD-10-CM | POA: Insufficient documentation

## 2023-03-02 NOTE — Therapy (Signed)
OUTPATIENT OCCUPATIONAL THERAPY ORTHO EVALUATION  Patient Name: Jody Paul MRN: [id number] DOB:12/12/1975, 47 y.o., female Today's Date: 03/02/2023  PCP: Olevia Perches, Demetrius Charity, DO REFERRING PROVIDER: Modena Nunnery, MD  END OF SESSION:  OT End of Session - 03/02/23 2046     Visit Number 1    Number of Visits 24    Date for OT Re-Evaluation 05/25/23    OT Start Time 1618    OT Stop Time 1710    OT Time Calculation (min) 52 min    Activity Tolerance Patient tolerated treatment well;Patient limited by pain    Behavior During Therapy [facility] for tasks assessed/performed             No past medical history on file. Past Surgical History:  Procedure Laterality Date   CESAREAN SECTION  05/18/2005   NO PAST SURGERIES     TUBAL LIGATION  1998   Patient Active Problem List   Diagnosis Date Noted   Anemia 07/23/2021   Asthma 07/23/2021   DDD (degenerative disc disease), cervical 07/23/2021   Fibroid 07/23/2021   Headache 12/05/2019   Intractable chronic migraine without aura 10/25/2019   Injury of head 10/25/2019   Loss of consciousness (HCC) 10/25/2019   Migraine 10/25/2019   Lesion of ulnar nerve 02/06/2019   Anesthesia of skin 02/06/2019   History of abnormal cervical Pap smear 01/13/2018   Acute swimmer's ear of both sides 02/11/2017   Cervical spinal stenosis 07/26/2014   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 Reports receiving a brace yesterday, and has  thumb IP extension stiffness since wearing a brace for the past 24 hours with minimal removal time.  Pt accompanied by: self  PERTINENT HISTORY: Pt. is a 3 y. o. female who presents with a left Thumb Trigger Finger which started on 01/30/2023. At its worst Pt. Has 10/10  pain with the finger mostly triggering "locking" with IP flexion, however over the past day Pt.'s finger has "locked" into IP extension.  Pt. has tried, and received Acupuncture over the past month. 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   PRECAUTIONS: None  WEIGHT BEARING RESTRICTIONS: No  PAIN:  Left thumb 10/10 at its worst. Pain in the volar aspect of the left thumb MP, PIP with soreness in the thenar eminence, as well as the dorsal aspect of the thumb web space. Pain wakes the Pt. up at night.     PLOF: Independent, ADLs/IADLs, working as a Engineer, civil (consulting)  PATIENT GOALS: To decrease pain, and be able to use her hand pain free  OBJECTIVE:   Note: Objective measures were completed at Evaluation unless otherwise noted.  HAND DOMINANCE: TBD  ADL/IADLs:  Overall ADL/IADLs: Pt. has difficulty using her left hand to open a drink, open packages and packets, tying strings on clothing, and performing work related tasks.   FUNCTIONAL OUTCOME MEASURES: FOTO:  53, TR score: 69    Left ROM:  Thumb active radial abduction: 46 degrees with 2/10 pain  between 25-46 degrees. Thumb IP flexion 0(35) Thumb opposition: to the 2nd, 3rd, and 4th digits with 2/10 pain.    HAND FUNCTION:  Grip, and pinch strength N/A 2/2 pain  SENSATION: Intact;  no numbness, or tingling noted  EDEMA:  Mild edema  TODAY'S TREATMENT:                                                                                                                              DATE: 03/02/2023  Paraffin:  Pt. Tolerated Paraffin bath to the left hand with a towel wrap for 10 min. for pain, and stiffness in preparation for manual therapy, and ROM.   Manual Therapy:  Pt. tolerated  soft tissue massage to the volar aspect of the left thumb from the PIP to the MP, the thenar eminence, as well as the dorsal aspect of the thumb webspace 2/2 pain, and stiffness.  Manual therapy was performed independent of, and in preparation for there. Ex.   Therapeutic ex.:   Pt. Participated in PROM followed by AROM for left thumb IP flexion with blocking the MP.   Pt. Reports receiving no instruction about recommended wearing times for the brace that she received yesterday. Pt. Reports having worn it nearly the full 24 hours since receiving it, only removing it minimally. Pt. education was provided about wearing the brace at night time when the pain is the greatest, as well during the day during activity, however periodically removing the brace to rest the left thumb, and perform massage, and ROM to the thumb. Pt. Education was provided about avoiding forceful gripping with the left hand.     PATIENT EDUCATION: Education details: OT services, POC, Goals, heat, contrast bath, and massage Person educated: Patient Education method: Medical illustrator Education comprehension: verbalized understanding  HOME EXERCISE PROGRAM:  Heat, contrast bath, massage   GOALS: Goals reviewed with patient? Yes  SHORT TERM GOALS: Target date: 04/13/2023    Pt. will be independent with HEPs Baseline: Eval: No current HEP Goal status: INITIAL   LONG TERM GOALS: Target date: 05/25/2023    Pt. will increase FOTO score by 2 points to reflect Pt. perceived improvement with assessment specific ADL/IADL's.  Baseline: Eval: FOTO: 53, TR score: 69 Goal status: INITIAL  2.  Pt. will decrease pain by 2 points overall on the pain scale to improve ROM, and function. Baseline: Eval: Pain at its worst 10/10,  2-6/10 with ROM, massage. Goal status: INITIAL  3.  Pt. will Improve left Thumb ROM in preparation for being able to independently grasp items. Baseline: Active Thumb Radial Abduction 46 degrees, IP flexion 0(35), Limited thumb opposition to the 2nd, 3rd, and 4th digits with 2/10 pain. Goal status: INITIAL  4.  Pt. will be able to efficiently use her left hand to  assist with tying clothing strings, and opening packets/packages. Baseline: Pt. presents with difficulty using her left hand to perform these tasks 2/2 pain, and limited ROM Goal status: INITIAL  5.  Pt. Will identify joint protection principals for ADLs, and IADLs. Baseline: Eval: Education to be provided as needed. Goal status: INITIAL  ASSESSMENT:  CLINICAL IMPRESSION:  Patient is a  47 y.o. female who was seen today for occupational therapy evaluation for left thumb trigger finger. Pt. presents with left thumb pain, thumb IP extension stiffness after wearing a new brace over the past 24 hours, limited thumb opposition, and radial thumb abduction.  Pt. Reports that thumb typically triggers with thumb flexion. These limitations hinder her ability to open a drink, open packets/packages, tie string on clothing, and perform work related tasks.  Pt. Will benefit form OT services to work towards improving pain, improving ROM, providing education about joint protection, and maximizing overall function in ADLs, and ADLs.  PERFORMANCE DEFICITS: in functional skills including ADLs, IADLs, coordination, dexterity, edema, ROM, strength, pain, Fine motor control, and UE functional use, cognitive skills including , and psychosocial skills including coping strategies and environmental adaptation.   IMPAIRMENTS: are limiting patient from ADLs, IADLs, work, and leisure.   CO-MORBIDITIES: may have co-morbidities  that affects occupational performance. Patient will benefit from skilled OT to address above impairments and improve overall function.  MODIFICATION OR ASSISTANCE TO COMPLETE EVALUATION: Min-Moderate modification of tasks or assist with assess necessary to complete an evaluation.  OT OCCUPATIONAL PROFILE AND HISTORY: Detailed assessment: Review of records and additional review of physical, cognitive, psychosocial history related to current functional performance.  CLINICAL DECISION MAKING: Moderate  - several treatment options, min-mod task modification necessary  REHAB POTENTIAL: Good  EVALUATION COMPLEXITY: Moderate    PLAN:  OT FREQUENCY: 2x/week  OT DURATION: 12 weeks  PLANNED INTERVENTIONS: 97535 self care/ADL training, 13086 therapeutic exercise, 97530 therapeutic activity, 97140 manual therapy, 97035 ultrasound, 97018 paraffin, 57846 moist heat, 97034 contrast bath, 97033 iontophoresis, 97760 Orthotics management and training, 96295 Splinting (initial encounter), passive range of motion, energy conservation, patient/family education, and DME and/or AE instructions  RECOMMENDED OTHER SERVICES: OT  CONSULTED AND AGREED WITH PLAN OF CARE: Patient  PLAN FOR NEXT SESSION: continue with treatment   Olegario Messier, MS, OTR/L  03/02/2023, 9:03 PM

## 2023-03-04 ENCOUNTER — Ambulatory Visit: Payer: BC Managed Care – PPO | Admitting: Occupational Therapy

## 2023-03-09 ENCOUNTER — Ambulatory Visit: Payer: BC Managed Care – PPO | Admitting: Occupational Therapy

## 2023-03-09 DIAGNOSIS — M65312 Trigger thumb, left thumb: Secondary | ICD-10-CM

## 2023-03-09 NOTE — Therapy (Signed)
OUTPATIENT OCCUPATIONAL THERAPY ORTHO EVALUATION  Patient Name: Jody Paul MRN: [id number] DOB:03-15-1976, 47 y.o., female Today's Date: 03/09/2023  PCP: Olevia Perches, Demetrius Charity, DO REFERRING PROVIDER: Modena Nunnery, MD  END OF SESSION:  OT End of Session - 03/09/23 1831     Visit Number 2    Number of Visits 24    Date for OT Re-Evaluation 05/25/23    OT Start Time 1618    OT Stop Time 1705    OT Time Calculation (min) 47 min    Activity Tolerance Patient tolerated treatment well;Patient limited by pain    Behavior During Therapy [facility] for tasks assessed/performed             No past medical history on file. Past Surgical History:  Procedure Laterality Date   CESAREAN SECTION  05/18/2005   NO PAST SURGERIES     TUBAL LIGATION  1998   Patient Active Problem List   Diagnosis Date Noted   Anemia 07/23/2021   Asthma 07/23/2021   DDD (degenerative disc disease), cervical 07/23/2021   Fibroid 07/23/2021   Headache 12/05/2019   Intractable chronic migraine without aura 10/25/2019   Injury of head 10/25/2019   Loss of consciousness (HCC) 10/25/2019   Migraine 10/25/2019   Lesion of ulnar nerve 02/06/2019   Anesthesia of skin 02/06/2019   History of abnormal cervical Pap smear 01/13/2018   Acute swimmer's ear of both sides 02/11/2017   Cervical spinal stenosis 07/26/2014   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 Reports having the day off today, and keeping her grandchildren.  Pt accompanied by: self  PERTINENT HISTORY: Pt. is a 60 y. o. female who presents with a left Thumb Trigger Finger which started on 01/30/2023. At its worst Pt. Has 10/10 pain with the finger mostly triggering "locking" with IP flexion, however over the  past day Pt.'s finger has "locked" into IP extension.  Pt. has tried, and received Acupuncture over the past month. 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   PRECAUTIONS: None  WEIGHT BEARING RESTRICTIONS: No  PAIN:  Left thumb 10/10 at its worst. Pain in the volar aspect of the left thumb MP, PIP with soreness in the thenar eminence, as well as the dorsal aspect of the thumb web space. Pain wakes the Pt. up at night.     PLOF: Independent, ADLs/IADLs, working as a Engineer, civil (consulting)  PATIENT GOALS: To decrease pain, and be able to use her hand pain free  OBJECTIVE:   Note: Objective measures were completed at Evaluation unless otherwise noted.  HAND DOMINANCE: TBD  ADL/IADLs:  Overall ADL/IADLs: Pt. has difficulty using her left hand to open a drink, open packages and packets, tying strings on clothing, and performing work related tasks.   FUNCTIONAL OUTCOME MEASURES: FOTO:  53, TR score: 69    Left ROM:  Thumb active radial abduction: 46 degrees with 2/10 pain  between 25-46 degrees. Thumb IP flexion Eval: 0(35), 03/09/23: 35(70) Thumb opposition: to the 2nd, 3rd, and 4th digits with 2/10 pain.    HAND FUNCTION:  Grip, and pinch strength N/A 2/2 pain  SENSATION: Intact; no numbness, or tingling noted  EDEMA:  Mild edema  TODAY'S TREATMENT:                                                                                                                              DATE: 03/09/2023  Pt. was fit for, and provided with an Oval-8 splint size 9  Paraffin:  Pt. Tolerated Paraffin bath to the left hand with a towel wrap for 10 min. for pain, and stiffness in preparation for manual therapy, and ROM.   Manual Therapy:  Pt. tolerated  soft tissue massage to the volar aspect of the left thumb from the PIP to the MP, the thenar eminence, as well as the dorsal aspect of the thumb webspace 2/2 pain, and stiffness  following Paraffin. Soft tissue massage performed to the thumb abductors and dorsal aspect of the forearm musculature 2/2 reports of tightness/soreness with thumb movements. Manual therapy was performed independent of, and in preparation for there. Ex.   Therapeutic ex.:   Pt. participated in PROM followed by AROM for left thumb IP flexion with blocking the MP. AROM for radial, and palmar thumb abduction, and thumb opposition following paraffin, and manual therapy.  Contrast Bath:  Pt. Tolerated contrast heat pack to the left hand for 3 min. Followed by cold pack for 1 min. for 3 trials ending with heat for 3 min. At the end of the session for pain and edema.      PATIENT EDUCATION: Education details: OT services, POC, Goals, heat, contrast bath, and massage Person educated: Patient Education method: Medical illustrator Education comprehension: verbalized understanding  HOME EXERCISE PROGRAM:  Heat, contrast bath, massage   GOALS: Goals reviewed with patient? Yes  SHORT TERM GOALS: Target date: 04/13/2023    Pt. will be independent with HEPs Baseline: Eval: No current HEP Goal status: INITIAL   LONG TERM GOALS: Target date: 05/25/2023    Pt. will increase FOTO score by 2 points to reflect Pt. perceived improvement with assessment specific ADL/IADL's.  Baseline: Eval: FOTO: 53, TR score: 69 Goal status: INITIAL  2.  Pt. will decrease pain by 2 points overall on the pain scale to improve ROM, and function. Baseline: Eval: Pain at its worst 10/10,  2-6/10 with ROM, massage. Goal status: INITIAL  3.  Pt. will Improve left Thumb ROM in preparation for being able to independently grasp items. Baseline: Active Thumb Radial Abduction 46 degrees, IP flexion 0(35), Limited thumb opposition to the 2nd, 3rd, and 4th digits with 2/10 pain. Goal status: INITIAL  4.  Pt. will be able to efficiently use her left hand to assist with tying clothing strings, and opening  packets/packages. Baseline: Pt. presents with difficulty using her left hand to perform these tasks 2/2 pain, and limited ROM Goal status: INITIAL  5.  Pt. Will identify joint protection principals for ADLs, and IADLs. Baseline: Eval: Education to be provided as needed. Goal status: INITIAL  ASSESSMENT:  CLINICAL IMPRESSION:  Pt. presents with limited AROM, and PROM with  thumb PIP flexion, however has improved since the initial visit last week. Left thumb IP flexion improved from 0(35) at the eval to 35(70) today. Pt. continues to have clicking/popping with movement, however reports that its not locking into PIP extension as much. Pt. reports not wearing the brace as much, however continues to wear during heavier activity. Pt. was fit for and provided with an oval-8 ring size 9 for the left thumb. Pt. presents with 7/10 pain in the left PIP, and 5/10 pain in the left MP. Pt. continues to benefit from OT services to work towards improving pain, improving ROM, providing education about joint protection, and maximizing overall function in ADLs, and ADLs.  PERFORMANCE DEFICITS: in functional skills including ADLs, IADLs, coordination, dexterity, edema, ROM, strength, pain, Fine motor control, and UE functional use, cognitive skills including , and psychosocial skills including coping strategies and environmental adaptation.   IMPAIRMENTS: are limiting patient from ADLs, IADLs, work, and leisure.   CO-MORBIDITIES: may have co-morbidities  that affects occupational performance. Patient will benefit from skilled OT to address above impairments and improve overall function.  MODIFICATION OR ASSISTANCE TO COMPLETE EVALUATION: Min-Moderate modification of tasks or assist with assess necessary to complete an evaluation.  OT OCCUPATIONAL PROFILE AND HISTORY: Detailed assessment: Review of records and additional review of physical, cognitive, psychosocial history related to current functional  performance.  CLINICAL DECISION MAKING: Moderate - several treatment options, min-mod task modification necessary  REHAB POTENTIAL: Good  EVALUATION COMPLEXITY: Moderate    PLAN:  OT FREQUENCY: 2x/week  OT DURATION: 12 weeks  PLANNED INTERVENTIONS: 97535 self care/ADL training, 16109 therapeutic exercise, 97530 therapeutic activity, 97140 manual therapy, 97035 ultrasound, 97018 paraffin, 60454 moist heat, 97034 contrast bath, 97033 iontophoresis, 97760 Orthotics management and training, 09811 Splinting (initial encounter), passive range of motion, energy conservation, patient/family education, and DME and/or AE instructions  RECOMMENDED OTHER SERVICES: OT  CONSULTED AND AGREED WITH PLAN OF CARE: Patient  PLAN FOR NEXT SESSION: continue with treatment   Olegario Messier, MS, OTR/L  03/09/2023, 6:36 PM

## 2023-03-11 ENCOUNTER — Ambulatory Visit: Payer: BC Managed Care – PPO | Admitting: Occupational Therapy

## 2023-03-11 ENCOUNTER — Telehealth: Payer: Self-pay | Admitting: Occupational Therapy

## 2023-03-11 NOTE — Telephone Encounter (Signed)
Pt. did not arrive for her scheduled OT appointment this afternoon. An attempt was made to reach out to the Pt. without a response. The courtesy call was made to check in with the Pt., review the no show policy, and to review the next scheduled visit date/time.

## 2023-03-16 ENCOUNTER — Ambulatory Visit: Payer: BC Managed Care – PPO | Admitting: Occupational Therapy

## 2023-03-22 ENCOUNTER — Ambulatory Visit: Payer: BC Managed Care – PPO | Attending: Pediatrics | Admitting: Occupational Therapy

## 2023-03-22 DIAGNOSIS — M6281 Muscle weakness (generalized): Secondary | ICD-10-CM | POA: Insufficient documentation

## 2023-03-22 NOTE — Therapy (Signed)
OUTPATIENT OCCUPATIONAL THERAPY ORTHO TREATMENT  Patient Name: Jody Paul MRN: [id number] DOB:November 04, 1975, 47 y.o., female Today's Date: 03/22/2023  PCP: Olevia Perches, Demetrius Charity, DO REFERRING PROVIDER: Modena Nunnery, MD  END OF SESSION:  OT End of Session - 03/22/23 1751     Visit Number 3    Number of Visits 24    Date for OT Re-Evaluation 05/25/23    OT Start Time 1535    OT Stop Time 1615    OT Time Calculation (min) 40 min    Activity Tolerance Patient tolerated treatment well;Patient limited by pain    Behavior During Therapy [facility] for tasks assessed/performed             No past medical history on file. Past Surgical History:  Procedure Laterality Date   CESAREAN SECTION  05/18/2005   NO PAST SURGERIES     TUBAL LIGATION  1998   Patient Active Problem List   Diagnosis Date Noted   Anemia 07/23/2021   Asthma 07/23/2021   DDD (degenerative disc disease), cervical 07/23/2021   Fibroid 07/23/2021   Headache 12/05/2019   Intractable chronic migraine without aura 10/25/2019   Injury of head 10/25/2019   Loss of consciousness (HCC) 10/25/2019   Migraine 10/25/2019   Lesion of ulnar nerve 02/06/2019   Anesthesia of skin 02/06/2019   History of abnormal cervical Pap smear 01/13/2018   Acute swimmer's ear of both sides 02/11/2017   Cervical spinal stenosis 07/26/2014   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 reports having the day off today, and keeping her grandchildren.  Pt accompanied by: self  PERTINENT HISTORY: Pt. is a 47 y. o. female who presents with a left Thumb Trigger Finger which started on 01/30/2023. At its worst Pt. Has 10/10 pain with the finger mostly triggering "locking" with IP flexion, however over the  past day Pt.'s finger has "locked" into IP extension.  Pt. has tried, and received Acupuncture over the past month. 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   PRECAUTIONS: None  WEIGHT BEARING RESTRICTIONS: No  PAIN:  Pain is improving in the left thumb, however has pain wen first waking up in the morning, or if she grabs something. Pt. has a new onset of soreness in the left 2nd digit.      PLOF: Independent, ADLs/IADLs, working as a Engineer, civil (consulting)  PATIENT GOALS: To decrease pain, and be able to use her hand pain free  OBJECTIVE:   Note: Objective measures were completed at Evaluation unless otherwise noted.  HAND DOMINANCE: TBD  ADL/IADLs:  Overall ADL/IADLs: Pt. has difficulty using her left hand to open a drink, open packages and packets, tying strings on clothing, and performing work related tasks.   FUNCTIONAL OUTCOME MEASURES: FOTO:  53, TR score: 69    Left ROM:  Thumb active radial abduction: 46 degrees with 2/10 pain  between 25-46 degrees. Thumb IP flexion Eval: 0(35), 03/09/23: 35(70) Thumb opposition: to the 2nd, 3rd, and 4th digits with 2/10 pain.    HAND FUNCTION:  Grip, and pinch strength N/A 2/2 pain  SENSATION: Intact; no numbness, or tingling noted  EDEMA:  Mild edema  TODAY'S TREATMENT:  DATE: 03/22/2023  Pt. was fit for, and provided with a new Oval-8 splint size 8  Manual Therapy:  Pt. tolerated  soft tissue massage to the volar aspect of the left thumb from the PIP to the MP, the thenar eminence, as well as the dorsal aspect of the thumb webspace, the volar aspect of the 2nd digit, and base of the 2nd digit 2/2 pain, and stiffness. Manual therapy was performed following contrast Bath. Soft tissue massage performed to the thumb abductors and dorsal aspect  of the forearm musculature 2/2 reports of tightness/soreness with thumb movements. Manual therapy was performed independent of, and in preparation for there. Ex.   Therapeutic ex.:   Pt. was provided with education and a visual handout for tendon glides 2/2  2nd digit stiffness. Pt. Was provided with a HEP  for tendon glides for fisting.  Contrast Bath:  Pt. tolerated contrast heat pack to the left hand for 3 min. Followed by cold pack for 1 min. for 3 trials ending with heat for 3 min. At the end of the session for pain and edema.      PATIENT EDUCATION: Education details: OT services, POC, Goals, heat, contrast bath, and massage Person educated: Patient Education method: Medical illustrator Education comprehension: verbalized understanding  HOME EXERCISE PROGRAM:  Heat, contrast bath, massage   GOALS: Goals reviewed with patient? Yes  SHORT TERM GOALS: Target date: 04/13/2023    Pt. will be independent with HEPs Baseline: Eval: No current HEP Goal status: INITIAL   LONG TERM GOALS: Target date: 05/25/2023    Pt. will increase FOTO score by 2 points to reflect Pt. perceived improvement with assessment specific ADL/IADL's.  Baseline: Eval: FOTO: 53, TR score: 69 Goal status: INITIAL  2.  Pt. will decrease pain by 2 points overall on the pain scale to improve ROM, and function. Baseline: Eval: Pain at its worst 10/10,  2-6/10 with ROM, massage. Goal status: INITIAL  3.  Pt. will Improve left Thumb ROM in preparation for being able to independently grasp items. Baseline: Active Thumb Radial Abduction 46 degrees, IP flexion 0(35), Limited thumb opposition to the 2nd, 3rd, and 4th digits with 2/10 pain. Goal status: INITIAL  4.  Pt. will be able to efficiently use her left hand to assist with tying clothing strings, and opening packets/packages. Baseline: Pt. presents with difficulty using her left hand to perform these tasks 2/2 pain, and limited ROM Goal  status: INITIAL  5.  Pt. Will identify joint protection principals for ADLs, and IADLs. Baseline: Eval: Education to be provided as needed. Goal status: INITIAL  ASSESSMENT:  CLINICAL IMPRESSION:  Pt. pain in the left thumb is improving, however continues to have pain, and triggering in the left thumb, in the morning, as well as pain is she reaches to grab something. Pt. reports  a new onset of soreness in the volar aspect of the left 2nd digit, as well as the base of the 2nd digit at the MP. Pt. reports the Oval-8 ring has helped. Pt. continues to benefit from OT services to work towards improving pain, improving ROM, providing education about joint protection, and maximizing overall function in ADLs, and ADLs.  PERFORMANCE DEFICITS: in functional skills including ADLs, IADLs, coordination, dexterity, edema, ROM, strength, pain, Fine motor control, and UE functional use, cognitive skills including , and psychosocial skills including coping strategies and environmental adaptation.   IMPAIRMENTS: are limiting patient from ADLs, IADLs, work, and leisure.   CO-MORBIDITIES: may have co-morbidities  that affects occupational performance. Patient will benefit from skilled OT to address above impairments and improve overall function.  MODIFICATION OR ASSISTANCE TO COMPLETE EVALUATION: Min-Moderate modification of tasks or assist with assess necessary to complete an evaluation.  OT OCCUPATIONAL PROFILE AND HISTORY: Detailed assessment: Review of records and additional review of physical, cognitive, psychosocial history related to current functional performance.  CLINICAL DECISION MAKING: Moderate - several treatment options, min-mod task modification necessary  REHAB POTENTIAL: Good  EVALUATION COMPLEXITY: Moderate    PLAN:  OT FREQUENCY: 2x/week  OT DURATION: 12 weeks  PLANNED INTERVENTIONS: 97535 self care/ADL training, 04540 therapeutic exercise, 97530 therapeutic activity, 97140  manual therapy, 97035 ultrasound, 97018 paraffin, 98119 moist heat, 97034 contrast bath, 97033 iontophoresis, 97760 Orthotics management and training, 14782 Splinting (initial encounter), passive range of motion, energy conservation, patient/family education, and DME and/or AE instructions  RECOMMENDED OTHER SERVICES: OT  CONSULTED AND AGREED WITH PLAN OF CARE: Patient  PLAN FOR NEXT SESSION: continue with treatment   Olegario Messier, MS, OTR/L  03/22/2023, 5:53 PM

## 2023-03-30 ENCOUNTER — Ambulatory Visit: Payer: BC Managed Care – PPO | Admitting: Occupational Therapy

## 2023-04-06 ENCOUNTER — Ambulatory Visit: Payer: BC Managed Care – PPO | Admitting: Occupational Therapy

## 2023-04-06 DIAGNOSIS — M6281 Muscle weakness (generalized): Secondary | ICD-10-CM | POA: Diagnosis not present

## 2023-04-07 ENCOUNTER — Encounter: Payer: Self-pay | Admitting: Pediatrics

## 2023-04-07 ENCOUNTER — Ambulatory Visit: Payer: BC Managed Care – PPO | Admitting: Pediatrics

## 2023-04-07 VITALS — BP 107/69 | HR 65 | Temp 97.9°F | Resp 16 | Wt 124.0 lb

## 2023-04-07 DIAGNOSIS — M65312 Trigger thumb, left thumb: Secondary | ICD-10-CM

## 2023-04-07 NOTE — Therapy (Signed)
OUTPATIENT OCCUPATIONAL THERAPY ORTHO TREATMENT  Patient Name: Jody Paul MRN: [id number] DOB:1975/10/25, 47 y.o., female Today's Date: 04/07/2023  PCP: Olevia Perches, Demetrius Charity, DO REFERRING PROVIDER: Modena Nunnery, MD  END OF SESSION:  OT End of Session - 04/07/23 0856     Visit Number 4    Number of Visits 24    OT Start Time 1615    OT Stop Time 1650    OT Time Calculation (min) 35 min    Activity Tolerance Patient tolerated treatment well;Patient limited by pain    Behavior During Therapy [facility] for tasks assessed/performed             No past medical history on file. Past Surgical History:  Procedure Laterality Date   CESAREAN SECTION  05/18/2005   NO PAST SURGERIES     TUBAL LIGATION  1998   Patient Active Problem List   Diagnosis Date Noted   Anemia 07/23/2021   Asthma 07/23/2021   DDD (degenerative disc disease), cervical 07/23/2021   Fibroid 07/23/2021   Headache 12/05/2019   Intractable chronic migraine without aura 10/25/2019   Injury of head 10/25/2019   Loss of consciousness (HCC) 10/25/2019   Migraine 10/25/2019   Lesion of ulnar nerve 02/06/2019   Anesthesia of skin 02/06/2019   History of abnormal cervical Pap smear 01/13/2018   Acute swimmer's ear of both sides 02/11/2017   Cervical spinal stenosis 07/26/2014   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 reports having an MD appointment tomorrow.  Pt accompanied by: self  PERTINENT HISTORY: Pt. is a 82 y. o. female who presents with a left Thumb Trigger Finger which started on 01/30/2023. At its worst Pt. Has 10/10 pain with the finger mostly triggering "locking" with IP flexion, however over the past day Pt.'s finger has "locked" into IP extension.  Pt.  has tried, and received Acupuncture over the past month. 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   PRECAUTIONS: None  WEIGHT BEARING RESTRICTIONS: No  PAIN:  2/10 soreness pain in the right thumb     PLOF: Independent, ADLs/IADLs, working as a Engineer, civil (consulting)  PATIENT GOALS: To decrease pain, and be able to use her hand pain free  OBJECTIVE:   Note: Objective measures were completed at Evaluation unless otherwise noted.  HAND DOMINANCE: TBD  ADL/IADLs:  Overall ADL/IADLs: Pt. has difficulty using her left hand to open a drink, open packages and packets, tying strings on clothing, and performing work related tasks.   FUNCTIONAL OUTCOME MEASURES: FOTO:  53, TR score: 69    Left ROM:  Thumb active radial abduction: 46 degrees with 2/10 pain  between 25-46 degrees. Thumb IP flexion Eval: 0(35), 03/09/23: 35(70) Thumb opposition: to the 2nd, 3rd, and 4th digits with 2/10 pain.    HAND FUNCTION:  Grip, and pinch strength N/A 2/2 pain  SENSATION: Intact; no numbness, or tingling noted  EDEMA:  Mild edema  TODAY'S TREATMENT:  DATE: 04/06/2023    Manual Therapy:  Pt. tolerated  soft tissue massage to the volar aspect of the left thumb from the PIP to the MP, the thenar eminence, as well as the dorsal aspect of the thumb webspace, the volar aspect of the 2nd digit, and base of the 2nd digit 2/2 pain, and stiffness. Manual therapy was performed following contrast Bath. Soft tissue massage performed to the thumb abductors and dorsal aspect of the forearm musculature 2/2 reports of tightness/soreness with thumb movements. Manual therapy was performed independent of, and in preparation for there. Ex.   Therapeutic ex.:   Pt. was provided with education and a visual handout for tendon  glides 2/2  2nd digit stiffness. Pt. Was provided with a HEP  for tendon glides for fisting.  Paraffin Bath:  Pt. Tolerated Paraffin Bath for 10 min. Prior to manual therapy, and ROM.       PATIENT EDUCATION: Education details: OT services, POC, Goals, heat, contrast bath, and massage Person educated: Patient Education method: Medical illustrator Education comprehension: verbalized understanding  HOME EXERCISE PROGRAM:  Heat, contrast bath, massage   GOALS: Goals reviewed with patient? Yes  SHORT TERM GOALS: Target date: 04/13/2023    Pt. will be independent with HEPs Baseline: Eval: No current HEP Goal status: INITIAL   LONG TERM GOALS: Target date: 05/25/2023    Pt. will increase FOTO score by 2 points to reflect Pt. perceived improvement with assessment specific ADL/IADL's.  Baseline: Eval: FOTO: 53, TR score: 69 Goal status: INITIAL  2.  Pt. will decrease pain by 2 points overall on the pain scale to improve ROM, and function. Baseline: Eval: Pain at its worst 10/10,  2-6/10 with ROM, massage. Goal status: INITIAL  3.  Pt. will Improve left Thumb ROM in preparation for being able to independently grasp items. Baseline: Active Thumb Radial Abduction 46 degrees, IP flexion 0(35), Limited thumb opposition to the 2nd, 3rd, and 4th digits with 2/10 pain. Goal status: INITIAL  4.  Pt. will be able to efficiently use her left hand to assist with tying clothing strings, and opening packets/packages. Baseline: Pt. presents with difficulty using her left hand to perform these tasks 2/2 pain, and limited ROM Goal status: INITIAL  5.  Pt. Will identify joint protection principals for ADLs, and IADLs. Baseline: Eval: Education to be provided as needed. Goal status: INITIAL  ASSESSMENT:  CLINICAL IMPRESSION:  Pt. has a follow-up appointment tomorrow, and is anticipating having an injection. Pt. reports improving pain, and increased ROM. Pt. continues to  present with soreness overall. Pt. reports that she is able to to engage the hand more during daily ADL/IADL, and work related tasks. Pt. presents with less tightness in the thumb, and forearm musculature. Pt. continues to benefit from OT services to work towards improving pain, improving ROM, providing education about joint protection, and maximizing overall function in ADLs, and ADLs.  PERFORMANCE DEFICITS: in functional skills including ADLs, IADLs, coordination, dexterity, edema, ROM, strength, pain, Fine motor control, and UE functional use, cognitive skills including , and psychosocial skills including coping strategies and environmental adaptation.   IMPAIRMENTS: are limiting patient from ADLs, IADLs, work, and leisure.   CO-MORBIDITIES: may have co-morbidities  that affects occupational performance. Patient will benefit from skilled OT to address above impairments and improve overall function.  MODIFICATION OR ASSISTANCE TO COMPLETE EVALUATION: Min-Moderate modification of tasks or assist with assess necessary to complete an evaluation.  OT OCCUPATIONAL PROFILE AND HISTORY: Detailed assessment: Review  of records and additional review of physical, cognitive, psychosocial history related to current functional performance.  CLINICAL DECISION MAKING: Moderate - several treatment options, min-mod task modification necessary  REHAB POTENTIAL: Good  EVALUATION COMPLEXITY: Moderate    PLAN:  OT FREQUENCY: 2x/week  OT DURATION: 12 weeks  PLANNED INTERVENTIONS: 97535 self care/ADL training, 03474 therapeutic exercise, 97530 therapeutic activity, 97140 manual therapy, 97035 ultrasound, 97018 paraffin, 25956 moist heat, 97034 contrast bath, 97033 iontophoresis, 97760 Orthotics management and training, 38756 Splinting (initial encounter), passive range of motion, energy conservation, patient/family education, and DME and/or AE instructions  RECOMMENDED OTHER SERVICES: OT  CONSULTED AND  AGREED WITH PLAN OF CARE: Patient  PLAN FOR NEXT SESSION: continue with treatment   Olegario Messier, MS, OTR/L  04/07/2023, 8:58 AM

## 2023-04-07 NOTE — Progress Notes (Unsigned)
Office Visit  BP 107/69 (BP Location: Left Arm, Patient Position: Sitting, Cuff Size: Normal)   Pulse 65   Temp 97.9 F (36.6 C) (Oral)   Resp 16   Wt 124 lb (56.2 kg)   LMP 03/21/2023 (Exact Date)   SpO2 100%   BMI 22.68 kg/m    Subjective:    Patient ID: Jody Paul, female    DOB: 12/02/1975, 47 y.o.   MRN: [id number]  HPI: ZANAIYA KERZNER is a 47 y.o. female  Chief Complaint  Patient presents with   Trigger finger    Left hand 1st thumb   #Trigger Finger Ongoing OT which has helped but still having a lot of discomfort with locking She is interested in proceeding with injections She will continue OT treatments  Relevant past medical, surgical, family and social history reviewed and updated as indicated. Interim medical history since our last visit reviewed. Allergies and medications reviewed and updated.  ROS per HPI unless specifically indicated above     Objective:    BP 107/69 (BP Location: Left Arm, Patient Position: Sitting, Cuff Size: Normal)   Pulse 65   Temp 97.9 F (36.6 C) (Oral)   Resp 16   Wt 124 lb (56.2 kg)   LMP 03/21/2023 (Exact Date)   SpO2 100%   BMI 22.68 kg/m   Wt Readings from Last 3 Encounters:  04/07/23 124 lb (56.2 kg)  03/01/23 119 lb 12.8 oz (54.3 kg)  10/30/22 121 lb 9.6 oz (55.2 kg)     Physical Exam Musculoskeletal:       Hands:     Comments: Left thumb grossly normal on exam. Able to palpate nodule as above near a-1 pulley. During ROM testing IP joint locked and was placed back, very painful for patient. MP joint without locking.         04/07/2023    4:31 PM 10/30/2022    3:12 PM 10/01/2022    3:17 PM 06/08/2022    8:36 AM 07/23/2021    8:23 AM  Depression screen PHQ 2/9  Decreased Interest 0 0 0 0 0  Down, Depressed, Hopeless 0 0 0 0 0  PHQ - 2 Score 0 0 0 0 0  Altered sleeping 0 1 0 1 2  Tired, decreased energy 0 0 0 1 2  Change in appetite 0 0 0 1 1  Feeling bad or failure about yourself  0 0 0 0 0   Trouble concentrating 0 0 0 0 0  Moving slowly or fidgety/restless 0 0 0 0 0  Suicidal thoughts 0 0 0 0 0  PHQ-9 Score 0 1 0 3 5  Difficult doing work/chores Not difficult at all Somewhat difficult Not difficult at all Not difficult at all        04/07/2023    4:31 PM 10/30/2022    3:13 PM 10/01/2022    3:18 PM 06/08/2022    8:36 AM  GAD 7 : Generalized Anxiety Score  Nervous, Anxious, on Edge 0 0 0 0  Control/stop worrying 0 0 0 0  Worry too much - different things 0 0 0 0  Trouble relaxing 0 0 0 1  Restless 0 0 0 0  Easily annoyed or irritable 0 0 0 0  Afraid - awful might happen 0 0 0 0  Total GAD 7 Score 0 0 0 1  Anxiety Difficulty Not difficult at all Somewhat difficult Not difficult at all  Assessment & Plan:  Assessment & Plan   Trigger finger of left thumb Hand appears better though having severe IP locking with pain on exam. Will proceed with injection of the hand. See procedure note.  Follow up plan: Return in about 3 months (around 07/08/2023).  Donnielle Addison Howell Pringle, MD

## 2023-04-07 NOTE — Patient Instructions (Signed)
Trigger Finger Injection Aftercare  Will there be pain after my injection? After the injection you may notice immediate relief of pain as a result of the Lidocaine. There is a possibility of some temporary increased discomfort and swelling for up to 72 hours until the cortisone begins to work. If you do have pain, simply rest the joint and use ice. If you tolerate over the counter medications, you might try Tylenol, Aleve, or Advil for added relief. Please follow the manufacturer's dosing instructions.  How long can I expect relief? Relief varies with the patient and the condition. For some patients relief only lasts a few weeks, while others have decreased pain for several months. If you fail to improve from the injection please call us to follow up. If you feel that the relief has worn off from your last injection, you may return about 3 months later.  Is it possible to have an allergic reaction from my injection? Yes, however a true allergic reaction is rare. If you experience shortness of breath, hives, rash or irritation, dizziness, redness, or other unexplained symptoms please notify your Primary Care Provider and our office right away. Again, this reaction is rare. How often can I receive cortisone injections? We offer cortisone injections in the same joint around 3 months apart, depending on the physician.

## 2023-04-08 ENCOUNTER — Encounter: Payer: Self-pay | Admitting: Pediatrics

## 2023-04-08 NOTE — Progress Notes (Addendum)
Trigger Finger Injection Procedure Note  Indication(s): trigger finger of thumb of left hand  Pre-operative Diagnosis: trigger finger or thumb Post-operative Diagnosis: same  Location(s): left thumb  Allergies:  reviewed allergy section in the chart and none  Consent:   I reviewed the risks (bleeding, infection, pain, damage to structures, skin dyspigmentation or atrophy), benefits (improved finger mobility, decrease in size of flexor tendon nodule, relief of pain and inflammation), and alternatives (observation, PT/OT, surgery) to proposed procedure.  I answered all of the patient's questions and addressed all concerns.  Patient agreed to proceed.  Verbal consent obtained.  Time-out:  Performed immediately prior to procedure    Anesthesia: 1%lidocaine without epinephrine  Procedure Details:  Area was prepped with alcohol x3. Palpation-guided injection performed using G#25 1" needle, 1mL of solution (1% plain lidocaine 1mL, mixed with 40mg /mL triamcinolone 0.35mL) was injected to flexor tendon sheath overlying flexor tendon nodule. Simple pressure was applied to injection site for hemostasis, then small dressing was put in place.  The patient tolerated the procedure well.    Specimen(s): None    EBL: None  Condition: Stable  Complications:  None  Post-operative Instructions and Plan: Start ROM and stretching exercises after 24 hours. Discussed rare, transient phenomenon called steroid flare, which presents as mild warmth and pain the first few days post-injection.  Call or RTC if signs of wound infection ensue (worsening pain, purulent drainage, fluctuance, advancing redness, increasing warmth, fever, chills, sweats, malaise, nausea, vomiting). For post-operative pain, use ice OTC acetaminophen OTC ibuprofen as needed.    Shadeed Colberg Howell Pringle, MD

## 2023-04-12 ENCOUNTER — Ambulatory Visit: Payer: BC Managed Care – PPO | Admitting: Family Medicine

## 2023-04-13 ENCOUNTER — Ambulatory Visit: Payer: BC Managed Care – PPO | Admitting: Pediatrics

## 2023-04-22 ENCOUNTER — Ambulatory Visit: Payer: BC Managed Care – PPO | Attending: Pediatrics | Admitting: Occupational Therapy

## 2023-04-22 DIAGNOSIS — M6281 Muscle weakness (generalized): Secondary | ICD-10-CM | POA: Insufficient documentation

## 2023-04-22 NOTE — Therapy (Addendum)
OUTPATIENT OCCUPATIONAL THERAPY ORTHO TREATMENT  Patient Name: Jody Paul MRN: [id number] DOB:19-Mar-1976, 47 y.o., female Today's Date: 04/22/2023  PCP: Olevia Perches, Demetrius Charity, DO REFERRING PROVIDER: Modena Nunnery, MD  END OF SESSION:  OT End of Session - 04/22/23 1750     Visit Number 5    Number of Visits 24    Date for OT Re-Evaluation 05/25/23    OT Start Time 1633    OT Stop Time 1657    OT Time Calculation (min) 24 min    Activity Tolerance Patient tolerated treatment well;Patient limited by pain    Behavior During Therapy [facility] for tasks assessed/performed             No past medical history on file. Past Surgical History:  Procedure Laterality Date   CESAREAN SECTION  05/18/2005   NO PAST SURGERIES     TUBAL LIGATION  1998   Patient Active Problem List   Diagnosis Date Noted   Anemia 07/23/2021   Asthma 07/23/2021   DDD (degenerative disc disease), cervical 07/23/2021   Fibroid 07/23/2021   Headache 12/05/2019   Intractable chronic migraine without aura 10/25/2019   Injury of head 10/25/2019   Loss of consciousness (HCC) 10/25/2019   Migraine 10/25/2019   Lesion of ulnar nerve 02/06/2019   Anesthesia of skin 02/06/2019   History of abnormal cervical Pap smear 01/13/2018   Acute swimmer's ear of both sides 02/11/2017   Cervical spinal stenosis 07/26/2014   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 was late to the appointment this afternoon 2/2 getting stuck in traffic.   Pt accompanied by: self  PERTINENT HISTORY: Pt. is a 69 y. o. female who presents with a left Thumb Trigger Finger which started on 01/30/2023. At its worst Pt. Has 10/10 pain with the finger mostly triggering "locking" with IP flexion, however  over the past day Pt.'s finger has "locked" into IP extension.  Pt. has tried, and received Acupuncture over the past month. 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   PRECAUTIONS: None  WEIGHT BEARING RESTRICTIONS: No  PAIN:  Intermittent soreness pain at the lateral aspect of the right thumb IP joint, Not rated.     PLOF: Independent, ADLs/IADLs, working as a Engineer, civil (consulting)  PATIENT GOALS: To decrease pain, and be able to use her hand pain free  OBJECTIVE:   Note: Objective measures were completed at Evaluation unless otherwise noted.  HAND DOMINANCE: TBD  ADL/IADLs:  Overall ADL/IADLs: Pt. has difficulty using her left hand to open a drink, open packages and packets, tying strings on clothing, and performing work related tasks.   FUNCTIONAL OUTCOME MEASURES: FOTO:  53, TR score: 69    Left ROM:  Thumb active radial abduction: 46 degrees with 2/10 pain  between 25-46 degrees. Thumb IP flexion Eval: 0(35), 03/09/23: 35(70) Thumb opposition: to the 2nd, 3rd, and 4th digits with 2/10 pain.    HAND FUNCTION:  Grip, and pinch strength N/A 2/2 pain  SENSATION: Intact; no numbness, or tingling noted  EDEMA:  Mild edema  TODAY'S TREATMENT:  DATE: 04/22/2023  Manual Therapy:  Pt. tolerated  soft tissue massage to the volar aspect of the left thumb from the PIP to the MP, the thenar eminence, as well as the dorsal aspect of the thumb webspace, the volar aspect of the 2nd digit, and base of the 2nd digit 2/2 pain, and stiffness. Manual therapy was performed following contrast Bath. Soft tissue massage performed to the thumb abductors and dorsal aspect of the forearm musculature 2/2 reports of tightness/soreness with thumb movements. Manual therapy was performed independent of, and in  preparation for there. Ex.   Orthotic Fitting:  Pt. Was provided with a medium soft Thumb CMC restriction brace for the left hand.   Paraffin Bath:  Pt. Tolerated Paraffin Bath for 10 min. To the left hand 2/2 pain, and stiffness prior to manual therapy       PATIENT EDUCATION: Education details: OT services, POC, Goals, heat, contrast bath, and massage Person educated: Patient Education method: Medical illustrator Education comprehension: verbalized understanding  HOME EXERCISE PROGRAM:  Heat, contrast bath, massage   GOALS: Goals reviewed with patient? Yes  SHORT TERM GOALS: Target date: 04/13/2023    Pt. will be independent with HEPs Baseline: Eval: No current HEP Goal status: INITIAL   LONG TERM GOALS: Target date: 05/25/2023    Pt. will increase FOTO score by 2 points to reflect Pt. perceived improvement with assessment specific ADL/IADL's.  Baseline: Eval: FOTO: 53, TR score: 69 Goal status: INITIAL  2.  Pt. will decrease pain by 2 points overall on the pain scale to improve ROM, and function. Baseline: Eval: Pain at its worst 10/10,  2-6/10 with ROM, massage. Goal status: INITIAL  3.  Pt. will Improve left Thumb ROM in preparation for being able to independently grasp items. Baseline: Active Thumb Radial Abduction 46 degrees, IP flexion 0(35), Limited thumb opposition to the 2nd, 3rd, and 4th digits with 2/10 pain. Goal status: INITIAL  4.  Pt. will be able to efficiently use her left hand to assist with tying clothing strings, and opening packets/packages. Baseline: Pt. presents with difficulty using her left hand to perform these tasks 2/2 pain, and limited ROM Goal status: INITIAL  5.  Pt. Will identify joint protection principals for ADLs, and IADLs. Baseline: Eval: Education to be provided as needed. Goal status: INITIAL  ASSESSMENT:  CLINICAL IMPRESSION:  Pt. received an injection for the thumb trigger finger since she was seen last  in therapy. Pt. reports improving thumb mobility, and less pain. Pt. Reports being able to engage it more during daily ADL/IADL/work related tasks. Pt. reports still having triggering in the thumb IP joint. Pt. tolerated soft thumb CMC restriction splint fit well.  Pt. continues to benefit from OT services to work towards improving pain, improving ROM, providing education about joint protection, and maximizing overall function in ADLs, and ADLs.  PERFORMANCE DEFICITS: in functional skills including ADLs, IADLs, coordination, dexterity, edema, ROM, strength, pain, Fine motor control, and UE functional use, cognitive skills including , and psychosocial skills including coping strategies and environmental adaptation.   IMPAIRMENTS: are limiting patient from ADLs, IADLs, work, and leisure.   CO-MORBIDITIES: may have co-morbidities  that affects occupational performance. Patient will benefit from skilled OT to address above impairments and improve overall function.  MODIFICATION OR ASSISTANCE TO COMPLETE EVALUATION: Min-Moderate modification of tasks or assist with assess necessary to complete an evaluation.  OT OCCUPATIONAL PROFILE AND HISTORY: Detailed assessment: Review of records and additional review of physical, cognitive, psychosocial  history related to current functional performance.  CLINICAL DECISION MAKING: Moderate - several treatment options, min-mod task modification necessary  REHAB POTENTIAL: Good  EVALUATION COMPLEXITY: Moderate    PLAN:  OT FREQUENCY: 2x/week  OT DURATION: 12 weeks  PLANNED INTERVENTIONS: 97535 self care/ADL training, 84696 therapeutic exercise, 97530 therapeutic activity, 97140 manual therapy, 97035 ultrasound, 97018 paraffin, 29528 moist heat, 97034 contrast bath, 97033 iontophoresis, 97760 Orthotics management and training, 41324 Splinting (initial encounter), passive range of motion, energy conservation, patient/family education, and DME and/or AE  instructions  RECOMMENDED OTHER SERVICES: OT  CONSULTED AND AGREED WITH PLAN OF CARE: Patient  PLAN FOR NEXT SESSION: continue with treatment   Olegario Messier, MS, OTR/L  04/22/2023

## 2023-07-08 ENCOUNTER — Ambulatory Visit: Payer: Self-pay | Admitting: Pediatrics

## 2023-07-12 ENCOUNTER — Ambulatory Visit: Payer: Self-pay | Admitting: Pediatrics

## 2023-07-13 ENCOUNTER — Ambulatory Visit: Payer: Self-pay | Admitting: Pediatrics

## 2023-07-20 ENCOUNTER — Ambulatory Visit: Payer: Self-pay | Admitting: Pediatrics

## 2023-07-20 VITALS — BP 128/81 | HR 73 | Temp 98.0°F | Ht 62.0 in | Wt 122.4 lb

## 2023-07-20 DIAGNOSIS — M6289 Other specified disorders of muscle: Secondary | ICD-10-CM | POA: Diagnosis not present

## 2023-07-20 DIAGNOSIS — N949 Unspecified condition associated with female genital organs and menstrual cycle: Secondary | ICD-10-CM

## 2023-07-20 DIAGNOSIS — M5416 Radiculopathy, lumbar region: Secondary | ICD-10-CM | POA: Diagnosis not present

## 2023-07-20 DIAGNOSIS — Z1330 Encounter for screening examination for mental health and behavioral disorders, unspecified: Secondary | ICD-10-CM

## 2023-07-20 MED ORDER — METRONIDAZOLE 0.75 % VA GEL
1.0000 | Freq: Every day | VAGINAL | 0 refills | Status: AC
Start: 2023-07-20 — End: 2023-07-25

## 2023-07-20 NOTE — Progress Notes (Unsigned)
 Office Visit  BP 128/81 (BP Location: Left Arm, Patient Position: Sitting, Cuff Size: Normal)   Pulse 73   Temp 98 F (36.7 C) (Oral)   Ht 5\' 2"  (1.575 m)   Wt 122 lb 6.4 oz (55.5 kg)   LMP 07/17/2023 (Exact Date)   SpO2 98%   BMI 22.39 kg/m    Subjective:    Patient ID: Jody Paul, female    DOB: 09/01/1975, 48 y.o.   MRN: [id number]  HPI: Jody Paul is a 48 y.o. female  Chief Complaint  Patient presents with   Vaginal Pain    Patient said it started about a week ago   Back Pain    Patient said its been going on and off and it started waking her up at night    #vaginal discomfort #LBP #Pelvic floor Pt reporting vaginal discomfort, requesting a wet prep swab She has had recurrent BV without resolution She is doing pelvic PT for SI joint dysfunction and lumbar radiculopathy Requesting MRI, no red flag symptoms  Relevant past medical, surgical, family and social history reviewed and updated as indicated. Interim medical history since our last visit reviewed. Allergies and medications reviewed and updated.  ROS per HPI unless specifically indicated above     Objective:    BP 128/81 (BP Location: Left Arm, Patient Position: Sitting, Cuff Size: Normal)   Pulse 73   Temp 98 F (36.7 C) (Oral)   Ht 5\' 2"  (1.575 m)   Wt 122 lb 6.4 oz (55.5 kg)   LMP 07/17/2023 (Exact Date)   SpO2 98%   BMI 22.39 kg/m   Wt Readings from Last 3 Encounters:  07/20/23 122 lb 6.4 oz (55.5 kg)  04/07/23 124 lb (56.2 kg)  03/01/23 119 lb 12.8 oz (54.3 kg)     Physical Exam Constitutional:      Appearance: Normal appearance.  Pulmonary:     Effort: Pulmonary effort is normal.  Musculoskeletal:     Lumbar back: Positive right straight leg raise test. Negative left straight leg raise test.     Comments: SI joint tenderness  Skin:    Comments: Normal skin color  Neurological:     General: No focal deficit present.     Mental Status: She is alert. Mental status is at  baseline.  Psychiatric:        Mood and Affect: Mood normal.        Behavior: Behavior normal.        Thought Content: Thought content normal.         07/20/2023    4:28 PM 04/07/2023    4:31 PM 10/30/2022    3:12 PM 10/01/2022    3:17 PM 06/08/2022    8:36 AM  Depression screen PHQ 2/9  Decreased Interest 0 0 0 0 0  Down, Depressed, Hopeless 0 0 0 0 0  PHQ - 2 Score 0 0 0 0 0  Altered sleeping 1 0 1 0 1  Tired, decreased energy 0 0 0 0 1  Change in appetite 0 0 0 0 1  Feeling bad or failure about yourself  0 0 0 0 0  Trouble concentrating 0 0 0 0 0  Moving slowly or fidgety/restless 0 0 0 0 0  Suicidal thoughts 0 0 0 0 0  PHQ-9 Score 1 0 1 0 3  Difficult doing work/chores Somewhat difficult Not difficult at all Somewhat difficult Not difficult at all Not difficult at all  07/20/2023    4:28 PM 04/07/2023    4:31 PM 10/30/2022    3:13 PM 10/01/2022    3:18 PM  GAD 7 : Generalized Anxiety Score  Nervous, Anxious, on Edge 0 0 0 0  Control/stop worrying 0 0 0 0  Worry too much - different things 0 0 0 0  Trouble relaxing 0 0 0 0  Restless 0 0 0 0  Easily annoyed or irritable 0 0 0 0  Afraid - awful might happen 0 0 0 0  Total GAD 7 Score 0 0 0 0  Anxiety Difficulty Not difficult at all Not difficult at all Somewhat difficult Not difficult at all       Assessment & Plan:  Assessment & Plan   Vaginal discomfort Recurrent BV. Will treat with vaginal cream as below.  -     WET PREP FOR TRICH, YEAST, CLUE -     metroNIDAZOLE; Place 1 Applicatorful vaginally daily for 5 days.  Dispense: 50 g; Refill: 0 -     MR SACRUM SI JOINTS W WO CONTRAST; Future -     MR LUMBAR SPINE W WO CONTRAST; Future  Pelvic floor dysfunction Right lumbar radiculitis She is requesting and MRI due to persistence radiculopathy and pelvic floor dysfunction despite pelvic floor PT. Would like done [facility]. No red flag sx. Order as below. -     MR SACRUM SI JOINTS W WO CONTRAST; Future -     MR  LUMBAR SPINE W WO CONTRAST; Future  Encounter for behavioral health screening As part of their intake evaluation, the patient was screened for depression, anxiety.  PHQ9 SCORE 1, GAD7 SCORE 0. Screening results negative for tested conditions. CTM.    Follow up plan: No follow-ups on file.  Jackolyn Confer, MD

## 2023-07-21 LAB — WET PREP FOR TRICH, YEAST, CLUE
Clue Cell Exam: POSITIVE — AB
Trichomonas Exam: NEGATIVE
Yeast Exam: NEGATIVE

## 2023-07-27 ENCOUNTER — Telehealth: Payer: Self-pay | Admitting: Family Medicine

## 2023-07-27 NOTE — Telephone Encounter (Signed)
 Patient called requesting update on MRI order for "total spine and sacral". I do not see orders to assist patient further and advised I will send to provider for assistance. She does request to be sent to [facility] as she works there.   Headache update- She states frequency has now increased and she experiences one nearly everyday. Wondered if there was a medication that could be trailed.

## 2023-07-27 NOTE — Telephone Encounter (Unsigned)
 Copied from CRM [id number]. Topic: Referral - Question >> Jul 27, 2023  4:05 PM Marland Kitchen D wrote: Patient called needing a update about referral  [phone]

## 2023-07-29 ENCOUNTER — Encounter: Payer: Self-pay | Admitting: Pediatrics

## 2023-07-29 NOTE — Patient Instructions (Signed)
 MRI order placed General Vaginal and Vulvar Care  Get to know your body: The vulva includes all the external organs you can see outside your body. The vulva includes the mons pubis, labia majora (outer lips), labia minora (inner lips), clitoris, and the external openings of the urethra and vagina. People often confuse the vulva with the vagina. The vagina, also known as the birth canal, is inside your body. Only the opening of the vagina (introitus) can be seen from the outside.   Consider the following recommendations:   Nothing is needed to clean the vagina, the vulva can be cleaned with fragrance free soap such as Dove fragrance-free bar soap, Cetaphil, Basis or Vanicream soap.  Use soft, white, unscented toilet paper.  Stick to the same brand of fragrance-free laundry detergent-- avoid bleach, fabric softeners, and dryer sheets. All Free & Clear is a good choice.  Avoid daily pantiliners; if that is not possible, use unscented brands. Try not to wear pads at home. We recommend Stayfree, Kotex, or organic pads. Avoid using baby wipes/ personal wipes, douches, perfumes, feminine hygiene products, witch hazel to vulvar tissue. If you must use a wipe, consider Water Wipes: 99% water.  Nail polish or acrylic nails can irritate the vulva.  Avoid ALL over the counter itch products (Vagisil, Lanocaine, Vagicaine, Benzocaine, etc.) Use all cotton underwear, not just cotton crotch. Avoid girdles, thongs, and try to keep underwear loose.  Avoid pantyhose-- buy knee or thigh-highs.  If you have difficulty cleaning after a bowel movement, try using Cetaphil body wash or mineral oil after a bowel movement to help remove feces. DON'T RUB! Albolene moisturizing cleanser is a makeup remover that also works well.  Avoid sitting in a wet bathing suit or remaining in sweaty exercise clothing. Vaseline or coconut oil are good lubricants to use after bathing.  For dry skin, CeraVe cream is a good choice.  For  intercourse, try fragrance-free over the counter lubricants such as KY, Replens, Astroglide. Avoid lubricants that will 'heat on contact'. Don't use oil-based lubricants with condoms.  Consider buying a plastic sitz bath from a pharmacy that can fit into your toilet and provide soothing comfort to the vulva; or use the tub. Plain warm water is best.  Follow a healthy diet, low sugar, limit alcohol and consider adding a daily probiotic supplement such as Nature's Way, Renew Life, Raw Probiotics  Read about your health, The V Book by Zollie Beckers is an excellent resource.

## 2023-08-05 ENCOUNTER — Encounter: Payer: Self-pay | Admitting: Pediatrics

## 2023-08-16 ENCOUNTER — Ambulatory Visit: Admitting: Nurse Practitioner

## 2023-08-16 NOTE — Progress Notes (Deleted)
   LMP 07/17/2023 (Exact Date)    Subjective:    Patient ID: Jody Paul, female    DOB: [birth year], 48 y.o.   MRN: [id number]  HPI: Jody Paul is a 48 y.o. female  No chief complaint on file.  HEADACHES    Relevant past medical, surgical, family and social history reviewed and updated as indicated. Interim medical history since our last visit reviewed. Allergies and medications reviewed and updated.  Review of Systems  Per HPI unless specifically indicated above     Objective:    LMP 07/17/2023 (Exact Date)   Wt Readings from Last 3 Encounters:  07/20/23 122 lb 6.4 oz (55.5 kg)  04/07/23 124 lb (56.2 kg)  03/01/23 119 lb 12.8 oz (54.3 kg)    Physical Exam  Results for orders placed or performed in visit on 07/20/23  WET PREP FOR TRICH, YEAST, CLUE   Collection Time: 07/20/23 12:00 AM   Specimen: Sterile Swab  Result Value Ref Range   Trichomonas Exam Negative Negative   Yeast Exam Negative Negative   Clue Cell Exam Positive (A) Negative      Assessment & Plan:   Problem List Items Addressed This Visit   None    Follow up plan: No follow-ups on file.

## 2023-11-15 ENCOUNTER — Ambulatory Visit: Admitting: Nurse Practitioner

## 2024-02-08 ENCOUNTER — Ambulatory Visit: Admitting: Pediatrics

## 2024-02-08 ENCOUNTER — Encounter: Payer: Self-pay | Admitting: Pediatrics

## 2024-02-08 VITALS — BP 117/80 | HR 60 | Wt 121.0 lb

## 2024-02-08 DIAGNOSIS — M65312 Trigger thumb, left thumb: Secondary | ICD-10-CM

## 2024-02-08 NOTE — Progress Notes (Signed)
 Office Visit  BP 117/80   Pulse 60   Wt 121 lb (54.9 kg)   LMP 02/02/2024   SpO2 98%   BMI 22.13 kg/m    Subjective:    Patient ID: Jody Paul, female    DOB: 03/21/1976, 48 y.o.   MRN: [id number]  HPI: Jody Paul is a 48 y.o. female  Chief Complaint  Patient presents with   Hand Pain    Discussed the use of AI scribe software for clinical note transcription with the patient, who gave verbal consent to proceed.  History of Present Illness   Jody Paul is a 48 year old female who presents with recurrent thumb pain and stiffness.  She has been experiencing recurrent pain and stiffness in her thumb, which initially resolved after an injection on November 20th of the previous year. By December 6th, the thumb had 'completely unlocked,' and she had no issues until recently.  Last week, she began experiencing stiffness in her thumb during sleep, causing it to lock up at night. She describes the thumb as 'tight' and notes that it does not lock during the day. She is concerned about the pain potentially returning to the severity experienced before the previous injection.  She recalls that the injection was administered around the thumb area and is uncertain if the current symptoms warrant another injection or if therapy might be more appropriate. She is concerned about the potential for the condition to worsen if not addressed.      Relevant past medical, surgical, family and social history reviewed and updated as indicated. Interim medical history since our last visit reviewed. Allergies and medications reviewed and updated.  ROS per HPI unless specifically indicated above     Objective:    BP 117/80   Pulse 60   Wt 121 lb (54.9 kg)   LMP 02/02/2024   SpO2 98%   BMI 22.13 kg/m   Wt Readings from Last 3 Encounters:  02/08/24 121 lb (54.9 kg)  07/20/23 122 lb 6.4 oz (55.5 kg)  04/07/23 124 lb (56.2 kg)     Physical Exam Constitutional:       Appearance: Normal appearance.  Pulmonary:     Effort: Pulmonary effort is normal.  Musculoskeletal:        General: Normal range of motion.     Left hand: Normal. No swelling or tenderness. Normal range of motion.  Skin:    Comments: Normal skin color  Neurological:     General: No focal deficit present.     Mental Status: She is alert. Mental status is at baseline.  Psychiatric:        Mood and Affect: Mood normal.        Behavior: Behavior normal.        Thought Content: Thought content normal.         02/08/2024    3:26 PM 07/20/2023    4:28 PM 04/07/2023    4:31 PM 10/30/2022    3:12 PM 10/01/2022    3:17 PM  Depression screen PHQ 2/9  Decreased Interest 0 0 0 0 0  Down, Depressed, Hopeless 0 0 0 0 0  PHQ - 2 Score 0 0 0 0 0  Altered sleeping 0 1 0 1 0  Tired, decreased energy 0 0 0 0 0  Change in appetite 0 0 0 0 0  Feeling bad or failure about yourself  0 0 0 0 0  Trouble concentrating 0 0 0 0  0  Moving slowly or fidgety/restless 0 0 0 0 0  Suicidal thoughts 0 0 0 0 0  PHQ-9 Score 0 1 0 1 0  Difficult doing work/chores Not difficult at all Somewhat difficult Not difficult at all Somewhat difficult Not difficult at all       02/08/2024    3:26 PM 07/20/2023    4:28 PM 04/07/2023    4:31 PM 10/30/2022    3:13 PM  GAD 7 : Generalized Anxiety Score  Nervous, Anxious, on Edge 0 0 0 0  Control/stop worrying 0 0 0 0  Worry too much - different things 0 0 0 0  Trouble relaxing 0 0 0 0  Restless 0 0 0 0  Easily annoyed or irritable 0 0 0 0  Afraid - awful might happen 0 0 0 0  Total GAD 7 Score 0 0 0 0  Anxiety Difficulty Not difficult at all Not difficult at all Not difficult at all Somewhat difficult       Assessment & Plan:  Assessment & Plan   Trigger finger of left thumb Recurrent trigger finger in the left thumb, previously relieved by injection, now causing stiffness and locking. Injection not recommended due to mild symptoms. - Refer to sports medicine  specialist for ultrasound evaluation of the right thumb. Possible injection. - Consider occupational therapy if symptoms worsen. -     Ambulatory referral to Sports Medicine    Follow up plan: No follow-ups on file.  Hadassah SHAUNNA Nett, MD

## 2024-02-11 ENCOUNTER — Ambulatory Visit: Admitting: Family Medicine

## 2024-02-15 ENCOUNTER — Ambulatory Visit: Admitting: Family Medicine

## 2024-02-15 ENCOUNTER — Other Ambulatory Visit (INDEPENDENT_AMBULATORY_CARE_PROVIDER_SITE_OTHER): Payer: Self-pay | Admitting: Radiology

## 2024-02-15 ENCOUNTER — Encounter: Payer: Self-pay | Admitting: Family Medicine

## 2024-02-15 VITALS — BP 90/60 | HR 73 | Ht 62.0 in | Wt 121.6 lb

## 2024-02-15 DIAGNOSIS — M65312 Trigger thumb, left thumb: Secondary | ICD-10-CM | POA: Diagnosis not present

## 2024-02-15 MED ORDER — TRIAMCINOLONE ACETONIDE 40 MG/ML IJ SUSP
20.0000 mg | Freq: Once | INTRAMUSCULAR | Status: AC
  Administered 2024-02-15: 20 mg via INTRAMUSCULAR

## 2024-02-15 NOTE — Assessment & Plan Note (Signed)
 History of Present Illness Jody Paul is a 48 year old female who presents with recurrent left thumb pain and locking. She was referred by Dr. Herold for evaluation of her left thumb condition.  Left thumb pain and locking - Recurrent pain and locking in the left thumb since September of the previous year - Initial symptoms began with soreness, progressing to severe locking during sleep - Temporary relief with acupuncture, but symptoms worsened over time - Physical therapy attempted prior to injection - Received an injection on November 20th, which resolved symptoms by December 6th - Recurrence of pain and locking approximately two weeks ago - Current symptoms include aching in the volar aspect of the thumb, locking during sleep, and a clicking sensation - Pain described as 'irritating' - Symptom relief with hot water, topical diclofenac , heat, and ice - 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 PALPATION: Palpable triggering at left first digit. Tender nodule at volar MCP of left first digit. NEUROVASCULAR: Sensation intact in radial, ulnar, and median nerve distribution.  Assessment and Plan Trigger finger, left thumb Recurrent trigger finger in left thumb with aching and locking, less severe than previous episode. Palpable triggering and tender nodule at volar MCP. Treatment options including cortisone injection, brace, and physical therapy were discussed. - Administer cortisone injection to left thumb. - Instruct to wear a thumb spica brace for 1-2 weeks, removing only for brief activities. - Initiate physical therapy after cortisone injection takes effect. - Use topical Voltaren  for inflammation. - Avoid oral NSAIDs due to GI issues.

## 2024-02-15 NOTE — Patient Instructions (Signed)
 VISIT SUMMARY:  Today, we addressed your recurrent left thumb pain and locking, as well as your gastrointestinal symptoms. We provided a cortisone injection for your thumb and discussed a comprehensive plan to manage both issues.  YOUR PLAN:  TRIGGER FINGER, LEFT THUMB: You have recurrent pain and locking in your left thumb, known as trigger finger. This is less severe than your previous episode but still requires treatment. -We administered a cortisone injection to your left thumb. -Wear a thumb spica brace for 1-2 weeks, removing it only for brief activities. -Start physical therapy after the cortisone injection takes effect. -Use topical Voltaren  for inflammation. -Avoid oral NSAIDs due to your gastrointestinal issues.

## 2024-02-15 NOTE — Progress Notes (Signed)
 Primary Care / Sports Medicine Office Visit  Patient Information:  Patient ID: Jody Paul, female DOB: 1975/07/29 Age: 48 y.o. MRN: [id number]   Jody Paul is a pleasant 48 y.o. female presenting with the following:  Chief Complaint  Patient presents with   thumb pain    Left thumb pain and locking up in sleep x 2 weeks. Patient had injection last November which helped up until 2 weeks ago.     Vitals:   02/15/24 1552  BP: 90/60  Pulse: 73  SpO2: 99%   Vitals:   02/15/24 1552  Weight: 121 lb 9.6 oz (55.2 kg)  Height: 5' 2 (1.575 m)   Body mass index is 22.24 kg/m.  No results found.   Discussed the use of AI scribe software for clinical note transcription with the patient, who gave verbal consent to proceed.   Independent interpretation of notes and tests performed by another provider:   None  Procedures performed:   Procedure:  Injection of left thumb under ultrasound guidance. Ultrasound guidance utilized for out-of-plane approach, flexor tendon thickening noted Samsung HS60 device utilized with permanent recording / reporting. Verbal informed consent obtained and verified. Skin prepped in a sterile fashion. Ethyl chloride for topical local analgesia.  Completed without difficulty and tolerated well. Medication: triamcinolone  acetonide 40 mg/mL suspension for injection 0.5 mL total and 0.25 mL lidocaine 1% without epinephrine utilized for needle placement anesthetic Advised to contact for fevers/chills, erythema, induration, drainage, or persistent bleeding.   Pertinent History, Exam, Impression, and Recommendations:   Problem List Items Addressed This Visit     Trigger thumb, left thumb - Primary   History of Present Illness Jody Paul is a 48 year old female who presents with recurrent left thumb pain and locking. She was referred by Dr. Herold for evaluation of her left thumb condition.  Left thumb pain and locking - Recurrent  pain and locking in the left thumb since September of the previous year - Initial symptoms began with soreness, progressing to severe locking during sleep - Temporary relief with acupuncture, but symptoms worsened over time - Physical therapy attempted prior to injection - Received an injection on November 20th, which resolved symptoms by December 6th - Recurrence of pain and locking approximately two weeks ago - Current symptoms include aching in the volar aspect of the thumb, locking during sleep, and a clicking sensation - Pain described as 'irritating' - Symptom relief with hot water, topical diclofenac , heat, and ice - 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 PALPATION: Palpable triggering at left first digit. Tender nodule at volar MCP of left first digit. NEUROVASCULAR: Sensation intact in radial, ulnar, and median nerve distribution.  Assessment and Plan Trigger finger, left thumb Recurrent trigger finger in left thumb with aching and locking, less severe than previous episode. Palpable triggering and tender nodule at volar MCP. Treatment options including cortisone injection, brace, and physical therapy were discussed. - Administer cortisone injection to left thumb. - Instruct to wear a thumb spica brace for 1-2 weeks, removing only for brief activities. - Initiate physical therapy after cortisone injection takes effect. - Use topical Voltaren  for inflammation. - Avoid oral NSAIDs due to GI issues.      Relevant Orders   US  LIMITED JOINT SPACE STRUCTURES UP LEFT  Ambulatory referral to Occupational Therapy     Orders & Medications Medications:  Meds ordered this encounter  Medications   triamcinolone  acetonide (KENALOG-40) injection 20 mg   Orders Placed This Encounter   Procedures   US  LIMITED JOINT SPACE STRUCTURES UP LEFT   Ambulatory referral to Occupational Therapy     No follow-ups on file.     Selinda JINNY Ku, MD, [facility]   Primary Care Sports Medicine Primary Care and Sports Medicine at [facility]
